# Patient Record
Sex: Female | Born: 1975 | Race: White | Hispanic: No | Marital: Married | Smoking: Current every day smoker
Health system: Southern US, Community
[De-identification: ages and names within clinical notes are randomized; demographics above are authoritative.]

## PROBLEM LIST (undated history)

## (undated) DIAGNOSIS — R51 Headache: Secondary | ICD-10-CM

## (undated) DIAGNOSIS — I10 Essential (primary) hypertension: Secondary | ICD-10-CM

## (undated) HISTORY — PX: APPENDECTOMY: SHX54

## (undated) HISTORY — PX: TUBAL LIGATION: SHX77

## (undated) HISTORY — PX: OVARY SURGERY: SHX727

## (undated) HISTORY — PX: ESOPHAGOGASTRODUODENOSCOPY ENDOSCOPY: SHX5814

## (undated) HISTORY — PX: ABDOMINAL HYSTERECTOMY: SHX81

## (undated) HISTORY — DX: Headache: R51

## (undated) HISTORY — PX: TONSILLECTOMY: SUR1361

---

## 2002-08-20 ENCOUNTER — Other Ambulatory Visit: Admission: RE | Admit: 2002-08-20 | Discharge: 2002-08-20 | Payer: Self-pay | Admitting: Internal Medicine

## 2003-10-27 ENCOUNTER — Emergency Department (HOSPITAL_COMMUNITY): Admission: EM | Admit: 2003-10-27 | Discharge: 2003-10-27 | Payer: Self-pay | Admitting: Emergency Medicine

## 2003-10-28 ENCOUNTER — Emergency Department (HOSPITAL_COMMUNITY): Admission: EM | Admit: 2003-10-28 | Discharge: 2003-10-28 | Payer: Self-pay | Admitting: *Deleted

## 2004-12-27 ENCOUNTER — Ambulatory Visit: Payer: Self-pay | Admitting: Family Medicine

## 2005-01-03 ENCOUNTER — Ambulatory Visit: Payer: Self-pay | Admitting: Family Medicine

## 2005-03-10 ENCOUNTER — Emergency Department (HOSPITAL_COMMUNITY): Admission: EM | Admit: 2005-03-10 | Discharge: 2005-03-10 | Payer: Self-pay | Admitting: Emergency Medicine

## 2005-06-03 ENCOUNTER — Ambulatory Visit: Payer: Self-pay | Admitting: Family Medicine

## 2005-06-07 ENCOUNTER — Ambulatory Visit (HOSPITAL_COMMUNITY): Admission: RE | Admit: 2005-06-07 | Discharge: 2005-06-07 | Payer: Self-pay | Admitting: Family Medicine

## 2005-06-07 ENCOUNTER — Ambulatory Visit: Payer: Self-pay | Admitting: Family Medicine

## 2005-06-14 ENCOUNTER — Ambulatory Visit: Payer: Self-pay | Admitting: Family Medicine

## 2005-07-16 ENCOUNTER — Ambulatory Visit: Payer: Self-pay | Admitting: Family Medicine

## 2005-07-24 ENCOUNTER — Ambulatory Visit (HOSPITAL_COMMUNITY): Admission: RE | Admit: 2005-07-24 | Discharge: 2005-07-24 | Payer: Self-pay | Admitting: Family Medicine

## 2005-08-05 ENCOUNTER — Emergency Department (HOSPITAL_COMMUNITY): Admission: EM | Admit: 2005-08-05 | Discharge: 2005-08-05 | Payer: Self-pay | Admitting: Emergency Medicine

## 2005-08-08 ENCOUNTER — Inpatient Hospital Stay (HOSPITAL_COMMUNITY): Admission: AD | Admit: 2005-08-08 | Discharge: 2005-08-08 | Payer: Self-pay | Admitting: Obstetrics and Gynecology

## 2007-08-10 ENCOUNTER — Observation Stay (HOSPITAL_COMMUNITY): Admission: EM | Admit: 2007-08-10 | Discharge: 2007-08-11 | Payer: Self-pay | Admitting: Emergency Medicine

## 2007-08-11 ENCOUNTER — Inpatient Hospital Stay (HOSPITAL_COMMUNITY): Admission: AD | Admit: 2007-08-11 | Discharge: 2007-08-18 | Payer: Self-pay | Admitting: *Deleted

## 2007-08-11 ENCOUNTER — Ambulatory Visit: Payer: Self-pay | Admitting: *Deleted

## 2007-09-05 ENCOUNTER — Emergency Department (HOSPITAL_COMMUNITY): Admission: EM | Admit: 2007-09-05 | Discharge: 2007-09-05 | Payer: Self-pay | Admitting: Emergency Medicine

## 2008-09-19 ENCOUNTER — Inpatient Hospital Stay (HOSPITAL_COMMUNITY): Admission: AD | Admit: 2008-09-19 | Discharge: 2008-09-19 | Payer: Self-pay | Admitting: Obstetrics and Gynecology

## 2008-09-30 ENCOUNTER — Encounter (INDEPENDENT_AMBULATORY_CARE_PROVIDER_SITE_OTHER): Payer: Self-pay | Admitting: Obstetrics and Gynecology

## 2008-09-30 ENCOUNTER — Ambulatory Visit (HOSPITAL_COMMUNITY): Admission: RE | Admit: 2008-09-30 | Discharge: 2008-10-01 | Payer: Self-pay | Admitting: Obstetrics and Gynecology

## 2009-02-09 ENCOUNTER — Emergency Department (HOSPITAL_COMMUNITY): Admission: EM | Admit: 2009-02-09 | Discharge: 2009-02-09 | Payer: Self-pay | Admitting: Emergency Medicine

## 2009-04-07 ENCOUNTER — Emergency Department (HOSPITAL_COMMUNITY): Admission: EM | Admit: 2009-04-07 | Discharge: 2009-04-07 | Payer: Self-pay | Admitting: Emergency Medicine

## 2010-11-20 LAB — CBC
HCT: 34.6 % — ABNORMAL LOW (ref 36.0–46.0)
HCT: 32.4 % — ABNORMAL LOW (ref 36.0–46.0)
HCT: 39.3 % (ref 36.0–46.0)
Hemoglobin: 11.7 g/dL — ABNORMAL LOW (ref 12.0–15.0)
Hemoglobin: 11.1 g/dL — ABNORMAL LOW (ref 12.0–15.0)
Hemoglobin: 13.1 g/dL (ref 12.0–15.0)
MCHC: 33.9 g/dL (ref 30.0–36.0)
MCHC: 33.4 g/dL (ref 30.0–36.0)
MCHC: 34.4 g/dL (ref 30.0–36.0)
MCV: 92.9 fL (ref 78.0–100.0)
MCV: 91.7 fL (ref 78.0–100.0)
MCV: 92.4 fL (ref 78.0–100.0)
Platelets: 310 10*3/uL (ref 150–400)
Platelets: 239 10*3/uL (ref 150–400)
Platelets: 317 10*3/uL (ref 150–400)
RBC: 3.72 MIL/uL — ABNORMAL LOW (ref 3.87–5.11)
RBC: 3.5 MIL/uL — ABNORMAL LOW (ref 3.87–5.11)
RBC: 4.28 MIL/uL (ref 3.87–5.11)
RDW: 12.6 % (ref 11.5–15.5)
RDW: 12.4 % (ref 11.5–15.5)
RDW: 12.4 % (ref 11.5–15.5)
WBC: 8.6 10*3/uL (ref 4.0–10.5)
WBC: 10.6 10*3/uL — ABNORMAL HIGH (ref 4.0–10.5)
WBC: 14 10*3/uL — ABNORMAL HIGH (ref 4.0–10.5)

## 2010-11-20 LAB — URINALYSIS, ROUTINE W REFLEX MICROSCOPIC
Bilirubin Urine: NEGATIVE
Glucose, UA: NEGATIVE mg/dL
Hgb urine dipstick: NEGATIVE
Ketones, ur: NEGATIVE mg/dL
Nitrite: NEGATIVE
Protein, ur: NEGATIVE mg/dL
Specific Gravity, Urine: <1.005 — ABNORMAL LOW (ref 1.005–1.030)
Urobilinogen, UA: 0.2 mg/dL (ref 0.0–1.0)
pH: 6 (ref 5.0–8.0)

## 2010-11-20 LAB — DIFFERENTIAL
Basophils Absolute: 0.2 10*3/uL — ABNORMAL HIGH (ref 0.0–0.1)
Basophils Relative: 2 % — ABNORMAL HIGH (ref 0–1)
Eosinophils Absolute: 0.3 10*3/uL (ref 0.0–0.7)
Eosinophils Relative: 4 % (ref 0–5)
Lymphocytes Relative: 42 % (ref 12–46)
Lymphs Abs: 3.6 10*3/uL (ref 0.7–4.0)
Monocytes Absolute: 0.5 10*3/uL (ref 0.1–1.0)
Monocytes Relative: 6 % (ref 3–12)
Neutro Abs: 4 10*3/uL (ref 1.7–7.7)
Neutrophils Relative %: 47 % (ref 43–77)

## 2010-11-20 LAB — PREGNANCY, URINE: Preg Test, Ur: NEGATIVE

## 2010-12-18 NOTE — Consult Note (Signed)
NAME:  SHERIDEN, ARCHIBEQUE              ACCOUNT NO.:  [id number]   MEDICAL RECORD NO.:  [id number]          PATIENT TYPE:  INP   LOCATION:  5508                           FACILITY:   PHYSICIAN:  Antonietta Breach, M.D.  DATE OF BIRTH:  10-27-1975   DATE OF CONSULTATION:  08/10/2007  DATE OF DISCHARGE:                                 CONSULTATION   REASON FOR CONSULTATION:  Suicide attempt.   REQUESTING PHYSICIAN:  [facility].   HISTORY OF PRESENT ILLNESS:  Mrs. Winifred Bodiford is a 35 year old female  admitted to the [facility] on August 09, 2007 due to an  intentional drug overdose.   The patient does have a history of depression.  She developed suicidal  thoughts with plan on January 3.  She could not resist the thoughts.  She took an overdose of Tylenol and Excedrin PM.  She slashed both of  her forearms with a blade.   The patient does continue with depressed mood, low energy, difficulty  concentrating.  She acknowledges the suicide attempt.  She states that  she did want to get help and actually sought help after the attempt.   She does not have any hallucinations or delusions.  She is oriented to  all spheres and is cooperative with bedside care.   PAST PSYCHIATRIC HISTORY:  The patient does have a history of multiple  self-harm episodes and was admitted to [facility] in [place]  during her teenage years.   FAMILY PSYCHIATRIC HISTORY:  None known.   SOCIAL HISTORY:  Mrs. Val Eagle states that she is separated from her  husband.  They were married for over 10 years.  She has gone to live  back home with her mother.  She cites that her mother treats her like a  child.  The patient does have four children.  She smokes marijuana.  She  uses occasional alcohol.  Occupation:  Unemployed.   PAST MEDICAL HISTORY:  Asthma, status post, superficial lacerations to  the bilateral forearms, status post Tylenol and Excedrin PM overdose.   ALLERGIES:  SULFA.   MEDICATIONS:  The MAR is reviewed.  1. The patient is on Mucomyst.  2. She is on Ativan 0.5 mg q.4 hours p.r.n.   LABORATORY DATA:  Tylenol initially was 114.3 and then increased at is  peak to 124.1.   WBC 15.6, hemoglobin 11.8, platelet count 344.  Sodium 140, BUN 5,  creatinine 0.56.  Alcohol negative.  Aspirin negative.  SGOT 17, SGPT  13.  Urine drug screen positive for benzodiazepines and positive for  tetrahydrocannabinol.  Tricyclics negative.   REVIEW OF SYSTEMS:  Head, eyes, ears, nose, and throat, mouth,  neurologic, psychiatric, cardiovascular, respiratory, gastrointestinal,  genitourinary, skin, musculoskeletal, endocrine, metabolic, hematologic,  lymphatic:  All unremarkable, except for a slight anemia with hemoglobin  of 11.8.   PHYSICAL EXAMINATION:  VITAL SIGNS:  Temperature 98.6, pulse 69,  respiratory rate 18, blood pressure 135/78, O2 saturation on room air  97%.  GENERAL APPEARANCE:  Mrs. Val Eagle is a female appearing her chronologic  age, lying in a supine position in her  hospital bed with no abnormal  involuntary movements.  MENTAL STATUS:  Mrs. Val Eagle is alert.  Her attention span is within  normal limits.  Her eye contact is good.  Her affect is constricted with  tears.  Her mood is depressed.  She is oriented to all spheres.  Her  memory is intact to immediate, recent, and remote.  Her fund of  knowledge and intelligence are within normal limits.  Her speech  involves normal rate and prosody without dysarthria.  Her speech volume  is decreased.  Thought process logical, coherent, AND goal-directed.  No  looseness of associations.  Language expression and comprehension  intact.  Abstraction intact.  Thought content:  The patient acknowledges  suicidal intent.  She has thoughts of hopelessness and helplessness.  She has no delusions or hallucinations.  Insight is partial.  Judgment  is impaired for self-care outside the hospital.  However, she does have  the  capacity for informed consent regarding treatment.   ASSESSMENT:  AXIS I:  293.83 mood disorder not otherwise specified,  depressed (general medical and functional elements).  Rule out 296.33 major depressive disorder recurrent, severe.  Cannabis abuse.  AXIS II:  Deferred.  AXIS III:  See general medical section.  AXIS IV:  Primary support group.  Marital.  AXIS V:  20.   Mrs. Val Eagle is still at risk to harm herself.   RECOMMENDATIONS:  1. Would admit to an inpatient psychiatric unit once medically      cleared.  2. Psychotropic medications deferred at this time.  3. The undersigned provided ego supportive psychotherapy and      education.      Antonietta Breach, M.D.  Electronically Signed     JW/MEDQ  D:  08/10/2007  T:  08/11/2007  Job:  [id number]

## 2010-12-18 NOTE — H&P (Signed)
NAME:  [name] [name], [name] [name]              ACCOUNT NO.:  [id number]   MEDICAL RECORD NO.:  [id number]          PATIENT TYPE:  MAT   LOCATION:  MATC                          FACILITY:  [facility]   PHYSICIAN:  Hal Morales, M.D.DATE OF BIRTH:  04-29-1976   DATE OF ADMISSION:  09/19/2008  DATE OF DISCHARGE:  09/19/2008                              HISTORY & PHYSICAL   HISTORY OF PRESENT ILLNESS:  The patient is a 35 year old white married  female para 4-0-1-4 who presents for definitive therapy of abnormal  uterine bleeding and pelvic pain.  The patient gives a history of pelvic  pain off and on for about 11 years.  She states that prior to her last  delivery she did not have significant pelvic pain.  Since that time she  has had intermittent episodes of pain, irregular menses that last for as  much as 2 weeks but has had an episode of off and on bleeding for as  much as 6 months.  Her last menstrual period began in November of 2009  and bled off and on until February.  She has used birth control pills  and other hormonal measures without significant and satisfactory relief  of her bleeding.  She began menarche at age 73 and had fairly regular  periods up until the time that she had her children, then about 11 years  ago after the birth of her last child began to have the above noted  problems.   She tried oral contraceptive pills but discontinued those secondary to  palpitations.  She had used Provera in the past which seemed to work for  her bleeding but she had a rash breakout and discontinued Provera.  She  has used Depo-Provera in the past but complained of weight gain.  She  has never used Lupron therapy.   In 2000 the patient had an appendectomy and right salpingo-oophorectomy  for endometriosis.  She had tubal ligation for sterilization later in  2000 and has had irregular menses since that time.   She does have a history of pelvic inflammatory disease treated at age  26.   She  currently complains of intermittent abdominal pain, dyspareunia, but  denies dysuria, urinary frequency or urgency or hematuria.  She has  never had a kidney stone.  She does not complain of constipation,  diarrhea or rectal bleeding.  She has no nausea or vomiting associated  with the pain.  She has never been diagnosed with fibroids.   Her last Pap smear was done on September 05, 2008, and showed low-grade  squamous intraepithelial lesion.  This was followed up with a  colposcopically directed biopsy which likewise showed low-grade squamous  intraepithelial lesion and a benign endocervical canal sampling.   OBSTETRICAL HISTORY:  Normal spontaneous vaginal delivery in 1996.  In  1998 cesarean section for twins.  In 1999 normal spontaneous vaginal  delivery after C-section of a 6 pound 7 ounce infant, the largest of her  deliveries.  She had a postpartum tubal sterilization.   GYN HISTORY:  See above.   PAST MEDICAL HISTORY:  The patient has  a history of asthma but currently  uses no medication.  She has had kidney infections, frequent headaches  and intestinal problems but currently requires no medication.  She has  had anemia in the past thought to be secondary to prolonged bleeding.  She has a history of migraines.  She likewise has a history of emotional  problems.   PAST SURGICAL HISTORY:  Appendectomy and right salpingo-oophorectomy in  2000, tubal ligation in 1999, tonsillectomy as a child, cesarean section  in 1998.  The patient has had two endoscopies in the past.   FAMILY HISTORY:  Positive for asthma, thyroid disease, headaches,  intestinal problems, anemia, bladder infection, migraines and emotional  problems.  Diet is regular.  The patient occasionally exercises.   SOCIAL HISTORY:  The patient is separated.  She does not smoke  cigarettes but is exposed to secondhand smoke.  She does not drink  alcohol.  The patient is unemployed.      Hal Morales, M.D.   Electronically Signed     VPH/MEDQ  D:  09/28/2008  T:  09/28/2008  Job:  [id number]

## 2010-12-18 NOTE — H&P (Signed)
NAME:  [name] [name], [name] [name]              ACCOUNT NO.:  [id number]   MEDICAL RECORD NO.:  [id number]          PATIENT TYPE:  IPS   LOCATION:  0304                          FACILITY:  [facility]   PHYSICIAN:  Jasmine Pang, M.D. DATE OF BIRTH:  October 03, 1975   DATE OF ADMISSION:  08/11/2007  DATE OF DISCHARGE:                       PSYCHIATRIC ADMISSION ASSESSMENT   IDENTIFYING INFORMATION:  A 35 year old female voluntarily admitted on  August 11, 2007.   HISTORY OF PRESENT ILLNESS:  The patient presents with a history of  intentional overdose, overdosing on 24 Excedrin tablets, and also  sustained a self-inflicted injury by cutting herself on both her  forearms.  She states that she wanted to die.  She did this on Sunday at  her ex-husband's home.  The patient went out and bought the medication  at [employer] with the intention to overdose.  She again also had cut both  her arms in a suicide attempt.  She states that she then took [place] up to  the emergency department but does not really remember been getting there  and had to be taken out of the parking lot.  She reports she has been  feeling very depressed.  She left her husband a few months ago because  things were getting physical and she did not want things to escalate.  Her sleep has been decreased.  She goes to bed around 2 and is up at 6.  Her appetite has been satisfactory.  She states that she is also living  in her mother's home at this time and does not like to deal with  issues. She states that the overdose was impulsive. She was not  drinking at the time.  She denies any alcohol or drug use.  And feels  that she needs to get some help.   PAST PSYCHIATRIC HISTORY:  First admission to the [facility].  No current outpatient treatment. She did receive some  counseling as a teenager. Has a history of cutting since the age of 25.  In the past has been on Zoloft but she states she had problems with  tachycardia. She was also on  some Prozac.   SOCIAL HISTORY:  This is a 35 year old separated female.  The patient is  living at home with her mother for the first time in 13 years.  The  patient has four children ages 73, twins at age 70, and on 47-year-old.  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 She has an eighth grade  education and currently has a boyfriend.   FAMILY HISTORY:  None.   ALCOHOL AND DRUG HISTORY:  She denies any alcohol or drug use.   PRIMARY CARE [place] Shiraishi:  She states that she does not like to go to  doctors and has not been. She has one but has not seen her doctor in  quite some time. Cannot recall MD's name.   MEDICAL PROBLEMS:  History of asthma.   MEDICATIONS:  She takes albuterol inhaler.   DRUG ALLERGIES:  SULFA.   PHYSICAL EXAM:  This is a young female who was fully assessed and  hospitalized at [facility] for her ingestion of Excedrin.  VITAL SIGNS:  Her temperature is 98.3, 77 heart rate, 20 respirations,  blood pressure 151/94, 172 pounds, 5 feet 4 inches tall.  EXTREMITIES:  She also has multiple superficial lacerations, linear  lacerations, to her forearms.  HEENT:  Also noted is subconjunctival hemorrhage to both her eyes.  She  states from vomiting when she overdosed.   LABORATORY RESULTS:  Her TSH is 3.125, total protein is 5.5, hemoglobin  of 10, hematocrit of 30.9, BUN 2, Tylenol was elevated at 124, down to  86 and down to 28.8. Urine drug screen is positive for benzodiazepines,  positive for cannabis. Pregnancy test is unavailable at this time.   MENTAL STATUS EXAM:  She is fully alert.  She is cooperative, good eye  contact, casually dressed.  Her speech is clear, normal in pace and  tone.  The patient's mood is depressed.  The patient is pleasant, sad,  wanting help.  Thought processes are coherent and goal directed.  Cognitive function intact.  Her memory is good.  Judgment and insight  are good.  She appears sincere.   IMPRESSION:  AXIS I:  Major depressive  disorder, recurrent.  Cannabis  abuse.  AXIS II:  Deferred.  AXIS III:  Asthma.  AXIS IV:  Problems with housing. Psychosocial problems. Possible  problems with primary support group.  AXIS V:  Current is 35.   PLANS:  Contract for safety.  Stabilize mood and thinking.  The patient  was willing to begin an antidepressant.  We discussed Celexa. The  patient is agreeable to beginning medication.  The patient is to  increase coping skills.  Will also address her substance use. Will have  a family session with her support group.  She does report her mother  being supportive.  Case manager will also determine her follow-up.  Her  tentative length of stay is  3-5 days.  The patient will need follow-up  in regards to her anemia that was discussed with her. We will start the  patient on an iron supplement at this time for her medical issues and  health maintenance.      Landry Corporal, N.P.      Jasmine Pang, M.D.  Electronically Signed    JO/MEDQ  D:  08/12/2007  T:  08/13/2007  Job:  [id number]

## 2010-12-18 NOTE — Op Note (Signed)
NAME:  [name] [name], [name] [name]              ACCOUNT NO.:  [id number]   MEDICAL RECORD NO.:  [id number]          PATIENT TYPE:  OIB   LOCATION:  9318                          FACILITY:  [facility]   PHYSICIAN:  Hal Morales, M.D.DATE OF BIRTH:  10-Feb-1976   DATE OF PROCEDURE:  09/30/2008  DATE OF DISCHARGE:  09/19/2008                               OPERATIVE REPORT   PREOPERATIVE DIAGNOSES:  1. Pelvic pain.  2. Abnormal uterine bleeding.  3. History of endometriosis.  4. Low-grade squamous intraepithelial lesion status post right      salpingo-oophorectomy.   POSTOPERATIVE DIAGNOSES:  1. Pelvic pain.  2. Abnormal uterine bleeding.  3. History of endometriosis.  4. Low-grade squamous intraepithelial lesion status post right      salpingo-oophorectomy.  5. Pelvic adhesions.   PROCEDURE:  Laparoscopically-assisted vaginal hysterectomy and left  ovarian cystectomy.   SURGEON:  Vanessa P. Pennie Rushing, MD   FIRST ASSISTANT:  Janine Limbo, MD   ANESTHESIA:  General orotracheal.   FINDINGS:  The uterus was normal-sized.  The patient was status post  right salpingo-oophorectomy.  The left fallopian tube contained a Falope  ring for surgical sterilization.  The left ovary contained 2 less than 2  cm simple cysts, both containing clear fluid.  There were adhesions  between the fimbriated end of the left fallopian tube and the posterior  uterus.   PROCEDURE:  The patient was taken to the operating room after  appropriate identification and placed on the operating table.  After the  attainment of adequate general anesthesia, she was placed in the  modified lithotomy position.  The abdomen, perineum, and vagina were  prepped with multiple layers of Betadine and a Hulka tenaculum placed on  the anterior cervix.  Foley catheter was inserted into the bladder and  connected to straight drainage.  The abdomen and perineum were draped as  a sterile field.  A subumbilical and suprapubic injection of  0.25%  Marcaine for total of 10 mL was undertaken and a subumbilical incision  made.  A Veress cannula was placed through that incision into the  peritoneal cavity.  Pneumoperitoneum was created with 4 liters of CO2.  The Veress cannula was removed and laparoscopic trocar placed through  the subumbilical incision into the peritoneal cavity.  The laparoscope  was placed through the trocar sleeve.  The above-noted findings were  made.  Suprapubic incisions to the right and left of midline were made  and laparoscopic probe trocars placed through those incisions into the  peritoneal cavity under direct visualization.  The aforementioned  adhesions between the fallopian tube and the posterior uterus were  sharply lysed and hemostasis achieved.  The left ovary was elevated and  the largest of the left ovarian cyst excised.  Hemostasis was achieved  with cautery.  At this point, the surgeons moved to the perineum as the  site of operation.  A weighted speculum was placed in the vagina and  Lahey tenaculum placed on the anterior and posterior surfaces of the  cervix.  The cervicovaginal mucosa was injected with a dilute solution  of Pitressin.  The cervix was circumscribed and the anterior vaginal  mucosa dissected off the anterior cervix bluntly.  The posterior  peritoneum was entered sharply.  This posterior peritoneum was then  tagged.  The uterosacral ligaments on the right and left side were  clamped, cut, and suture ligated and those sutures held.  The  paracervical tissues, parametria tissues were clamped, cut, and suture  ligated on the right and left side successively.  The anterior  peritoneum was entered and the bladder blade placed.  The upper pedicles  were then clamped, cut, and doubly suture ligated with removal of the  uterus and cervix from the operative field.  Hemostatic sutures were  then placed in the left vaginal cuff to allow for adequate hemostasis.  The left ovary was  visualized and the second of 2 left ovarian cysts  removed with cautery.  Once hemostasis was noted to be adequate, a  McCall culdoplasty suture was placed in the posterior cul-de-sac  incorporating the 2 uterosacral ligaments and the intervening posterior  peritoneum.  The vaginal angles were then created with the sutures that  had been held from the uterosacral ligaments being placed in the  anterior vaginal cuff and posterior vaginal cuff and tying down at the  vaginal angles.  The remainder of the vaginal cuff was then closed with  figure-of-eight sutures all sutures had been 0 Vicryl.  The vaginal cuff  closure was a single layer incorporating the anterior vaginal mucosa,  anterior peritoneum, posterior peritoneum, and posterior vaginal mucosa  in each stitch.  The McCall culdoplasty suture was tied down and the  vagina packed with a 2-inch packing moistened with Estrace cream.  The  surgeon changed gloves and the pneumoperitoneum was recreated.  The  pelvis was then visualized and found to be hemostatic after irrigation.  All instruments were then removed from the peritoneal cavity under  direct visualization as the CO2 was allowed to escape.  The subumbilical  incision was closed with a fascial suture of 0 Vicryl and subcuticular  sutures of 3-0 Monocryl.  The suprapubic incisions were closed with 3-0  sutures of Monocryl and a subcuticular fashion.  Sterile dressings were  applied.  The patient was awakened from general anesthesia and taken to  the recovery room in satisfactory condition having tolerated the  procedure well with sponge and instrument counts correct.   SPECIMENS TO PATHOLOGY:  Left ovarian cyst and uterus and cervix.   ESTIMATED BLOOD LOSS:  100 mL.      Hal Morales, M.D.  Electronically Signed     VPH/MEDQ  D:  09/30/2008  T:  10/01/2008  Job:  [id number]

## 2010-12-18 NOTE — Consult Note (Signed)
NAME:  [name] [name]              ACCOUNT NO.:  [id number]   MEDICAL RECORD NO.:  [id number]          PATIENT TYPE:  MAT   LOCATION:  MATC                          FACILITY:  [facility]   PHYSICIAN:  Janine Limbo, M.D.DATE OF BIRTH:  1976/06/27   DATE OF CONSULTATION:  DATE OF DISCHARGE:  09/19/2008                                 CONSULTATION   HISTORY OF PRESENT ILLNESS:  [name] [name] is a 35 year old female who  presents to the maternity admissions area at the [facility]  [place].  She has been followed at the [facility]  and Gynecology Division of [facility] for women.  The patient  presents complaining of severe abdominal pain.  The patient has a  history of chronic pelvic pain.  She is status post right salpingo-  oophorectomy because of pain in a dermoid cyst.  She is also status post  laparoscopy because of pelvic pain.  She was described to have  endometriosis, though no biopsy was obtained.  The patient has been  scheduled for hysterectomy on September 30, 2008, because of pelvic pain.  She reports that her pain has increased significantly.  She was given  Ultram and Flexeril for pain.  She reports that her pain increased to  the point that those medications were not sufficient.  She was given a  refill of those medications on September 18, 2008.  She presents to  maternity admissions today because her pain is progressed to the point  that she says she can no longer tolerate the discomfort.  The patient  has a history of pelvic inflammatory disease as a teenager.  She is  status post appendectomy.   OBSTETRICAL HISTORY:  The patient has had four term deliveries.   DRUG ALLERGIES:  SULFA MEDICATIONS.   PAST MEDICAL HISTORY:  The patient has a history of migraine headaches  and she has a history of asthma.  The patient did have a suicide attempt  in January 2009.   REVIEW OF SYSTEMS:  The patient reports having bowel movements twice a  day and she denies GI symptoms.   SOCIAL HISTORY:  The patient is married and she denies cigarette  smoking.   FAMILY HISTORY:  Noncontributory.   PHYSICAL EXAMINATION:  VITAL SIGNS:  Blood pressure is 154/96,  respirations 18, pulse 85, temperature is 97.8.  HEENT:  Within normal limits except for the patient it does appear to be  moderately uncomfortable.  CHEST:  Clear.  HEART:  Regular rate and rhythm.  ABDOMEN:  Soft and there is no guarding or rebound.  EXTREMITIES:  Grossly normal.  NEUROLOGIC:  Grossly normal.  PELVIC:  External genitalia is normal.  Vagina is normal.  Cervix is  slightly tender.  Uterus is normal size, shape, and consistently and  minimally tender.  Adnexa, right adnexa is nontender, but left adnexa is  very tender.  No masses are appreciated on the left.   LABORATORY DATA:  White blood cell count is 8600, hemoglobin is 11.7,  hematocrit is 34.6%, platelet count is 310,000.  Urinalysis is negative.   Ultrasound, no  pelvic masses are appreciated.  The ultrasound is normal.  There is a 1.6 cm minimally complex cyst on the left.   EMERGENCY ROOM COURSE:  The patient was evaluated in the emergency  department at [facility].  She was given 60 mg of Toradol IM.  The  patient reports that her discomfort was not relieved.  An ultrasound was  obtained.  She was watched for approximately 2-3 hours.  Her vital signs  remained stable.  The patient was then given Stadol 2 mg IM and  Phenergan 12.5 mg IM.  She was discharged to home.  Her husband will  drive.   ASSESSMENT:  1. Left lower quadrant pain of uncertain etiology.  2. Questionable history of endometriosis and a possibility that her      pain is from endometriosis.  3. A 1.6 cm mentally complex left ovarian cyst.  4. Anemia.   PLAN:  1. The patient will be discharge to home.  She will follow up with Dr.      Pennie Rushing in preparation for her surgery.  Of note is the patient did      have a low grade  squamous intraepithelial lesion on her most recent      Pap smear and colposcopy with biopsies will be performed prior to      surgery.  2. The patient was given a prescription for Percocet 5/325 and she      will take 1-2 tablets every 4 hours as needed for severe pain.  She      was given a total of 20 tablets with no refills.  She was also      given Phenergan and she can take 25 mg every 6 hours as needed for      nausea.      Janine Limbo, M.D.  Electronically Signed     AVS/MEDQ  D:  09/19/2008  T:  09/20/2008  Job:  [id number]

## 2010-12-18 NOTE — Discharge Summary (Signed)
NAME:  [name] [name], [name] [name]              ACCOUNT NO.:  [id number]   MEDICAL RECORD NO.:  [id number]          PATIENT TYPE:  INP   LOCATION:  5508                         FACILITY:  [facility]   PHYSICIAN:  Madaline Savage, MD        DATE OF BIRTH:  1976/01/27   DATE OF ADMISSION:  08/10/2007  DATE OF DISCHARGE:  08/11/2007                               DISCHARGE SUMMARY   PRIMARY CARE PHYSICIAN:  Unassigned.   DISCHARGE DIAGNOSES:  1. Overdose on Excedrin PM.  2. Major depression  3. Attempted suicide.  4. Multiple self-inflicted superficial cuts on forearms.   DISCHARGE MEDICATIONS:  Ibuprofen 400 mg three times daily as needed.   HISTORY OF PRESENT ILLNESS:  For a full History of Present Illness see  the History and Physical by Dr. Della Goo.  In short Ms. Val Eagle  is a 35 year old lady who apparently overdosed on 24 tablets of Excedrin  PM one hour prior to coming to the ER.  She also cut her forearm several  times.  She has had three suicide attempts in the past.  She was  admitted for further evaluation and management.   PROBLEM LIST:  Excedrin overdose.  Excedrin contains Tylenol, caffeine,  and aspirin.  Her Tylenol level at four hours was 114.3 which is less  than 150, and so Mucomyst was not given to her.  Her Tylenol level was  followed which has come down to less than normal within 24 hours.  Her  alcohol level was less than 5.  She has been watched in the hospital for  more than 24 hours, and this time she is hemodynamically stable.  Her  liver enzymes at the time of discharge are within normal limits.  At  this time she is medically cleared for psychiatric admission.  She was  seen by Dr. Jeanie Sewer from psychiatric who recommends that she needs  inpatient psychiatric treatment.  He deferred psychotropics at this  time.   DISPOSITION:  She is now being discharged to inpatient psychiatry.  She  will be followed by the physicians at the psychiatric facility.      Madaline Savage, MD  Electronically Signed     PKN/MEDQ  D:  08/11/2007  T:  08/11/2007  Job:  [id number]

## 2010-12-18 NOTE — H&P (Signed)
NAME:  [name] [name], [name] [name]              ACCOUNT NO.:  [id number]   MEDICAL RECORD NO.:  [id number]          PATIENT TYPE:  AMB   LOCATION:  SDC                           FACILITY:  [facility]   PHYSICIAN:  Hal Morales, M.D.DATE OF BIRTH:  1976/01/12   DATE OF ADMISSION:  09/30/2008  DATE OF DISCHARGE:  09/19/2008                              HISTORY & PHYSICAL   ADDENDUM/CONTINUATION:   SOCIAL HISTORY:  The patient is divorced, does not smoke, is exposed to  secondhand smoke, and does not drink alcoholic beverages.  She is  currently unemployed.   REVIEW OF SYSTEMS:  Negative except as mentioned above.   PHYSICAL EXAMINATION:  CONSTITUTIONAL:  The patient is a well-developed  white female in no acute distress.  VITAL SIGNS:  The temperature is 98.3, pulse 66, respirations 16, blood  pressure 110/80, weight 164 pounds, height 5 feet 3 inches.  LUNGS:  Clear.  HEART:  Regular rate and rhythm.  ABDOMEN:  Soft without masses or organomegaly.  EXTREMITIES:  No clubbing, cyanosis or edema.  PELVIC:  EG, BUS shows minuscule condylomata over the clitoral hood.  The vagina is without lesions.  The cervix is without lesions.  The  uterus is upper limits of normal size, mildly tender to motion and  sounds to 9 cm.  The adnexa are without masses or tenderness.  Rectovaginal, no masses.   LABORATORY EVALUATION:  Urinalysis is negative.  Hemoglobin is 11.7.  White blood cell count is 8.6 with a normal differential.  The patient  has a normal TSH of 1.802 and a normal prolactin of 10.3.  Most recent  pelvic ultrasound done on September 19, 2008 showed a normal-appearing  uterus status post a right hysterectomy and no evidence for left ovarian  torsion.  The Pap smear showed low grade squamous intraepithelial  lesion.  Cervical biopsy showed low grade cervical intraepithelial  lesion and negative ECC.  Endometrial biopsy showed complex endometrial  hyperplasia with out atypia.   IMPRESSION:  1.  Abnormal uterine bleeding.  2. Pelvic pain.  3. History of untreated endometriosis.  4. 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  5. Complex endometrial hyperplasia without atypia.  6. Status post right salpingo-oophorectomy.  7. Condylomata.  8. History of asthma.  9. History of anemia.   DISPOSITION:  Discussions held with the patient concerning options for  management of her comprehensive problems.  In light of the multiple  gynecologic issues that are present, the patient has decided she wishes  to proceed with hysterectomy.  She will undergo laparoscopy with  evaluation for laparoscopically-assisted vaginal  hysterectomy at [facility] on September 30, 2008.  The risks of  anesthesia, bleeding, infection and damage to adjacent organs have been  explained to her.  It has also been explained that no guarantee can be  made concerning the relief of her pelvic pain.  She wishes to proceed.      Hal Morales, M.D.  Electronically Signed     VPH/MEDQ  D:  09/28/2008  T:  09/28/2008  Job:  [id number]

## 2010-12-18 NOTE — Discharge Summary (Signed)
NAME:  [name] [name], [name] [name]              ACCOUNT NO.:  [id number]   MEDICAL RECORD NO.:  [id number]          PATIENT TYPE:  IPS   LOCATION:  0304                          FACILITY:  [facility]   PHYSICIAN:  Jasmine Pang, M.D. DATE OF BIRTH:  1975/08/31   DATE OF ADMISSION:  08/11/2007  DATE OF DISCHARGE:  08/18/2007                               DISCHARGE SUMMARY   IDENTIFYING INFORMATION:  This is a 35 year old white female who was  admitted on August 11, 2007.   HISTORY OF PRESENT ILLNESS:  The patient presents with a history of  intentional overdose.  She was overdosing on 24 Excedrin tablets.  She  also sustained a self-inflicted injury by cutting herself on both her  forearms.  She states that she wanted to die.  She did this on the day  prior to admission at her ex-husband's home.  The patient went out and  bought the medication at [employer] with the intention to overdose.  She  again also cut both her arms in a suicide attempt.  She states she was  then taken to the emergency department, but cannot remember getting  there and had to be taken out of the parking lot.  She reports she has  been feeling very depressed.  She left her husband a few months ago  because things were getting physical.  She did not want things to  escalate.  Her sleep has been decreased.  She goes to bed around 2  o'clock in the morning and is up at 6 a.m.  Her appetite has been  satisfactory.  She states that she is also living in her mother's home  at night and does not like to deal with issues.  She states that the  overdose was impulsive.  She was not drinking at that time.  She denies  any alcohol or drug use.  She feels that she needs to get some help.  This is the first admission at the [facility].  There is  no current outpatient treatment.  She did receive some counseling as a  teenager.  She has a history of cutting since the age of 47.  In the  past, she has been on Zoloft, but she states  there was problems with  tachycardia.  She was also on some Prozac.  There is no alcohol or drug  use history.  She has a history of asthma, but states she has not been  to the doctor in a long time.  She takes an albuterol inhaler.   She is allergic to SULFA drugs.   PHYSICAL FINDINGS:  This young female was fully assessed and  hospitalized at [facility] for her ingestion of Excedrin.  There were no  acute physical or medical problems upon transfer here, other than  subconjunctival hemorrhage to both her eyes from vomiting when she  overdosed.   ADMISSION LABORATORIES:  TSH was 3.125, total protein was 5.5,  hemoglobin 10, hematocrit 30.9, and BUN 2.  Tylenol was elevated at 124,  then down to 86, and down to 28.8.  Urine drug screen was  positive for  benzodiazepines and positive for cannabis.   HOSPITAL COURSE:  Upon admission, the patient was started on Librium 25  mg p.o. q.6 h. p.r.n. symptoms of withdrawal and Ambien 10 mg p.o.  p.r.n. insomnia.  On August 12, 2007, the patient was started on Celexa  20 mg p.o. daily.  She was started on Midrin for migraine headaches.  On  August 14, 2007, Ambien was discontinued.  She was started on trazodone  50 mg at 9 p.m., may repeat x1.  On August 15, 2007, trazodone was  increased to 100 mg p.o. q.h.s.  On August 16, 2007, due to headache,  she was started on Ultram 50 mg p.o. x1.  She was started on Topamax 50  mg p.o. q.h.s.  On August 17, 2007, the patient was started on Restoril  30 mg p.o. q.h.s. for sleep.   In sessions with me, the patient was friendly and cooperative.  She also  participated appropriately in unit therapeutic groups and activities.  She discussed being under stress living with her mother.  There was a  lot of conflict there.  She had left her husband.  She discussed being  in treatment for depression as a teenager and had been given Prozac.  She has four children, ages 48, 5, 42, and 21 years old  respectively.  She works as a Child psychotherapist and describes this as a lot of stress.  As  hospitalization progressed, the patient continued to have difficulty  falling asleep.  Changes were made in her sleep medications as indicated  above.  She did become less depressed and less anxious.  No suicidal  ideation.  On August 16, 2007, the patient's boyfriend and the  patient's sister met for a family session.  The patient spoke about  temporarily planning to spend the night with her boyfriend, have her  sister pick her up each morning, and so she cannot stay at the house all  day long.  All agreed for this short-term plan.  The patient received a  lot of support from her boyfriend and sister.  She identified ways to  change her life by getting instructions talking about her feelings.  She  no longer felt suicidal.  On August 18, 2007, mental status had  improved markedly from admission status.  The patient was friendly and  cooperative with good eye contact.  Speech was of normal rate and flow.  Psychomotor activity was within normal limits.  Mood was less depressed  and less anxious.  Affect wide range.  There was no suicidal or  homicidal ideation.  No thoughts of self injurious behavior.  No  auditory or visual hallucinations.  No paranoia or delusions.  Thoughts  were logical and goal-directed.  Thought content, no predominant theme.  Cognitive was grossly back to baseline.  The patient was felt stable to  be discharged today.   DISCHARGE DIAGNOSES:  Axis I:  Major depression, recurrent and severe  and polysubstance abuse.  AXIS II:  None.  AXIS III:  Asthma.  AXIS IV:  Severe (problems with housing, psychosocial problems, problems  with primary support group, burden of psychiatric illness).  AXIS V:  Global assessment of functioning upon discharge was 50.  Global  assessment of functioning upon admission was 35.  Global assessment of  functioning highest past year was 60-65.    DISCHARGE PLANS:  There were no specific activity level or dietary  restrictions.   POST HOSPITAL CARE PLANS:  The patient  will be seen at the [facility]  for medications and counseling on August 18, 2007, at 10 a.m.  She will  also receive follow up with her primary care doctor for her medical  problems, including her decreased hemoglobin and hematocrit.   DISCHARGE MEDICATIONS:  1. Celexa 20 mg daily.  2. Iron 325 mg daily.  3. Multivitamin daily.  4. Restoril 30 mg at bedtime.  5. Topamax 50 mg at bedtime.      Jasmine Pang, M.D.  Electronically Signed     BHS/MEDQ  D:  08/18/2007  T:  08/18/2007  Job:  [id number]

## 2010-12-18 NOTE — H&P (Signed)
NAME:  [name] [name], [name] [name]              ACCOUNT NO.:  [id number]   MEDICAL RECORD NO.:  [id number]          PATIENT TYPE:  INP   LOCATION:  5508                         FACILITY:  [facility]   PHYSICIAN:  Della Goo, M.D. DATE OF BIRTH:  [birth year]   DATE OF ADMISSION:  08/09/2007  DATE OF DISCHARGE:                              HISTORY & PHYSICAL   CHIEF COMPLAINT:  Overdose.   HISTORY OF PRESENT ILLNESS:  This is a 35 year old female who was  brought to the emergency department emergently about one hour after  taking an overdose of 24 tablets of Excedrin PM.  The patient's husband  is at bedside and gives the history.  The patient is drowsy, however, is  arousable and responding to questions, as well.  She reports that this  was an attempt to kill herself.  She also drank one beer and cut both of  her arms.  She reports self-mutilating since the age of 28.  She also  reports that this is the third suicide attempt/gesture.   PAST MEDICAL HISTORY:  History of asthma.   MEDICATIONS:  None at this time.   PAST SURGICAL HISTORY:  1. History of a right ovarian cyst removal.  2. Appendectomy.  3. Bilateral tubal ligation.   ALLERGIES:  TO SULFA.   SOCIAL HISTORY:  The patient is a smoker, smokes a pack per day.  She  also drinks alcohol, two beers daily and also reports marijuana usage.   FAMILY HISTORY:  Noncontributory.   PHYSICAL EXAMINATION FINDINGS:  GENERAL:  A 35 year old well-nourished,  well-developed female currently somnolent but arousable.  VITAL SIGNS:  Her vital signs are stable.  She is afebrile.  Temperature  98, blood pressure 149/89, heart rate 89, respirations 14, O2  saturations 99%-100%.  HEENT: Examination normocephalic, atraumatic.  Pupils sluggish but  reactive to light.  Extraocular muscles are intact.  Funduscopic benign.  Oropharynx is clear.  NECK:  Neck is supple, full range of motion.  No thyromegaly,  adenopathy, jugular venous distention.  CARDIOVASCULAR:  Regular rate and rhythm.  No murmurs, gallops or rubs.  LUNGS: Clear to auscultation bilaterally.  ABDOMEN: Positive bowel sounds, soft, nontender, nondistended.  EXTREMITIES: Without cyanosis, clubbing or edema.  NEUROLOGIC EXAMINATION:  Somnolent but arousable.  She is alert and  oriented times three.  There are no focal deficits on examination.  SKIN EXAMINATION:  Bilateral upper extremities with multiple linear  lacerations present along both upper extremities, the forearm areas.   LABORATORY STUDIES:  White blood cell count 15.6, hemoglobin 11.8,  hematocrit 35.8, platelets 344, MCV 81, neutrophils 61%, lymphocytes  29%.  Sodium 140, potassium 3.7, chloride 106, bicarb 25, BUN 5,  creatinine 0.56 and glucose of 100.  Albumin 4.2, AST 17, ALT 13.  Urine  drug screen positive for benzodiazepines and positive for cannabis.  Acetaminophen level initially 114.3, salicylate level less than 4.  Also  note that the acetaminophen level was felt to be at two hours post  ingestion.  A repeat Tylenol level was performed at four hours post  ingestion and was found to be 86.  ASSESSMENT:  A 35 year old female being admitted with:  1. Overdose.  2. Suicide attempt.  3. Bilateral upper extremity lacerations.  4. Marijuana abuse.  5. Tobacco abuse.   PLAN:  The patient will be admitted to an area for cardiac monitoring  and will be placed on suicide precautions.  She will be placed on IV  fluids for hydration and maintenance therapy.  Poison Control has been  contacted, and the emergency room physician decided to place the patient  on IV Mucomyst therapy to treat the Tylenol ingestion and possible  Tylenol toxicity.  GI and DVT prophylaxis have been ordered as well and  wound care evaluation of the patient's bilateral upper extremity  lacerations.  The patient will be evaluated by psychiatry.      Della Goo, M.D.  Electronically Signed     HJ/MEDQ  D:   08/10/2007  T:  08/11/2007  Job:  [id number]

## 2010-12-21 NOTE — Discharge Summary (Signed)
NAME:  [name], [name]              ACCOUNT NO.:  [id number]   MEDICAL RECORD NO.:  [id number]          PATIENT TYPE:  OIB   LOCATION:  9318                          FACILITY:  [facility]   PHYSICIAN:  Hal Morales, M.D.DATE OF BIRTH:  29-Jan-1976   DATE OF ADMISSION:  09/30/2008  DATE OF DISCHARGE:  10/01/2008                               DISCHARGE SUMMARY   DISCHARGE DIAGNOSES:  Pelvic pain, abnormal uterine bleeding, history of  endometriosis, low grade squamous intraepithelial lesion of the cervix,  pelvic adhesions, and adenomyosis.   OPERATION:  On the date of admission, the patient underwent a  laparoscopically-assisted vaginal hysterectomy and a left ovarian  cystectomy tolerating procedures well.   HISTORY OF PRESENT ILLNESS:  [name] [name] is a 35 year old married white  female para 4-0-1-4 who is status post bilateral tubal ligation and  right salpingo-oophorectomy presenting for definitive therapy of  abnormal uterine bleeding and pelvic pain.  Please see the patient's  dictated history and physical examination for details.   PREOPERATIVE PHYSICAL EXAMINATION:  VITAL SIGNS:  Blood pressure 110/80,  pulse 66, respirations 16, temperature 98.3, weight 164 pounds, height 5  feet 3 inches tall.  GENERAL:  Within normal limits.  PELVIC:  EGD, EUS showed miniscule, condylomata over the clitoral hood.  The vagina was without lesions.  The cervix was without lesions.  The  uterus appear upper limits of normal size, was mildly tender to motion,  and sound to 9 cm.  The adnexa were without any masses or tenderness.  RECTOVAGINAL:  Without any masses.   HOSPITAL COURSE:  On the day of admission, the patient underwent  aforementioned procedures, tolerating them all well.  Postoperative  course was marked by the patient having difficulty with pain control  utilizing Percocet; however, she found adequate relief with a  combination of Dilaudid and Toradol.  By postop day #1, the patient  had  resumed bowel and bladder habits and was tolerating a postop hemoglobin  of 11.1 (preop hemoglobin 13.1) and therefore was deemed ready for  discharge home.   DISCHARGE MEDICATIONS:  The patient was directed to her home medication  reconciliation form.  She was further prescribed amitriptyline 10 mg at  bedtime for 2 weeks, then she is to increase to 20 mg at bedtime for 2  weeks, then 25 mg at bedtime, Phenergan suppositories 25 mg every 6  hours as needed for nausea, ibuprofen 600 mg every 6 hours for 5 days,  then as needed for pain, Dilaudid 2 mg 1-2 tablets every 4 hours as  needed for pain.   FOLLOW UP:  The patient is to schedule a 6 weeks postoperative visit  with Dr. Pennie Rushing.   DISCHARGE INSTRUCTIONS:  The patient was given a copy of the booklet  entitled recovering from your surgery.  She was further advised to avoid  driving for 2 weeks, lifting over 5 pounds for 6 weeks, sexual activity  for 6 weeks, but she may shower.  Her diet was without restriction.   FINAL PATHOLOGY:  Left ovarian cyst excision:  Cystic follicle and  malignant features were not  identified.  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      [place]mira J. Adline Peals.      Hal Morales, M.D.  Electronically Signed    EJP/MEDQ  D:  10/18/2008  T:  10/19/2008  Job:  [id number]

## 2011-03-14 ENCOUNTER — Emergency Department (HOSPITAL_COMMUNITY)
Admission: EM | Admit: 2011-03-14 | Discharge: 2011-03-14 | Disposition: A | Discharge status: Home / Self Care | Payer: Self-pay | Attending: Emergency Medicine | Admitting: Emergency Medicine

## 2011-03-14 ENCOUNTER — Emergency Department (HOSPITAL_COMMUNITY): Payer: Self-pay

## 2011-03-14 ENCOUNTER — Encounter: Payer: Self-pay | Admitting: *Deleted

## 2011-03-14 DIAGNOSIS — J189 Pneumonia, unspecified organism: Secondary | ICD-10-CM | POA: Insufficient documentation

## 2011-03-14 DIAGNOSIS — J45909 Unspecified asthma, uncomplicated: Secondary | ICD-10-CM | POA: Insufficient documentation

## 2011-03-14 DIAGNOSIS — F17200 Nicotine dependence, unspecified, uncomplicated: Secondary | ICD-10-CM | POA: Insufficient documentation

## 2011-03-14 MED ORDER — HYDROCODONE-ACETAMINOPHEN 5-325 MG PO TABS
1.0000 | ORAL_TABLET | Freq: Once | ORAL | Status: AC
  Administered 2011-03-14: 1 via ORAL
  Filled 2011-03-14: qty 1

## 2011-03-14 MED ORDER — HYDROCODONE-ACETAMINOPHEN 5-325 MG PO TABS: 1.0000 | ORAL_TABLET | Freq: Once | ORAL | Status: AC

## 2011-03-14 MED ORDER — BENZONATATE 100 MG PO CAPS
200.0000 mg | ORAL_CAPSULE | Freq: Once | ORAL | Status: AC
  Administered 2011-03-14: 200 mg via ORAL
  Filled 2011-03-14: qty 2

## 2011-03-14 MED ORDER — CEFTRIAXONE SODIUM 1 G IJ SOLR
INTRAMUSCULAR | Status: AC
  Filled 2011-03-14: qty 1

## 2011-03-14 MED ORDER — AZITHROMYCIN 250 MG PO TABS: ORAL_TABLET | ORAL | Status: AC

## 2011-03-14 MED ORDER — BENZONATATE 200 MG PO CAPS: 200.0000 mg | ORAL_CAPSULE | Freq: Two times a day (BID) | ORAL | Status: AC | PRN

## 2011-03-14 MED ORDER — AZITHROMYCIN 250 MG PO TABS
500.0000 mg | ORAL_TABLET | Freq: Once | ORAL | Status: AC
  Administered 2011-03-14: 500 mg via ORAL
  Filled 2011-03-14: qty 2

## 2011-03-14 MED ORDER — LIDOCAINE HCL (PF) 1 % IJ SOLN
INTRAMUSCULAR | Status: AC
  Filled 2011-03-14: qty 5

## 2011-03-14 MED ORDER — CEFTRIAXONE SODIUM 250 MG IJ SOLR
1.0000 g | Freq: Once | INTRAMUSCULAR | Status: AC
  Administered 2011-03-14: 1 g via INTRAMUSCULAR

## 2011-03-14 NOTE — ED Provider Notes (Signed)
History     CSN: [id number] Arrival date & time: 03/14/2011  2:31 PM  Chief Complaint  Patient presents with  . Shortness of Breath    tightness in chest  . Cough   Patient is a 35 y.o. female presenting with shortness of breath and cough. The history is provided by the patient.  Shortness of Breath  The current episode started 5 to 7 days ago (She describes non productive cough,  occasional wheezing and increased shortness of breath which is worsened at night.). The problem occurs continuously (she spent last night mostly coughing,  rather than sleeing, and is having increased fatigue today.). The problem has been gradually worsening. The problem is moderate. The symptoms are relieved by nothing (Not relieved by otc cold and cough remedies tried.). The symptoms are aggravated by a supine position. Associated symptoms include cough, shortness of breath and wheezing. Pertinent negatives include no chest pain, no fever, no rhinorrhea and no sore throat.  Cough Associated symptoms include shortness of breath and wheezing. Pertinent negatives include no chest pain, no headaches, no rhinorrhea and no sore throat.    Past Medical History  Diagnosis Date  . Asthma     Past Surgical History  Procedure Date  . Abdominal hysterectomy   . Tubal ligation   . Appendectomy   . Tonsillectomy   . Ovary surgery     right ovary removed    History reviewed. No pertinent family history.  History  Substance Use Topics  . Smoking status: Current Everyday Smoker -- 0.5 packs/day  . Smokeless tobacco: Not on file  . Alcohol Use: No    OB History    Grav Para Term Preterm Abortions TAB SAB Ect Mult Living                  Review of Systems  Constitutional: Negative for fever.  HENT: Negative for congestion, sore throat, rhinorrhea and neck pain.   Eyes: Negative.   Respiratory: Positive for cough, shortness of breath and wheezing. Negative for chest tightness.   Cardiovascular: Negative for  chest pain.  Gastrointestinal: Negative for nausea and abdominal pain.  Genitourinary: Negative.   Musculoskeletal: Negative for joint swelling and arthralgias.  Skin: Negative.  Negative for rash and wound.  Neurological: Negative for dizziness, weakness, light-headedness, numbness and headaches.  Hematological: Negative.   Psychiatric/Behavioral: Negative.     Physical Exam  BP 168/92  Pulse 97  Temp(Src) 98.4 F (36.9 C) (Oral)  Resp 20  Ht 5\' 4"  (1.626 m)  Wt 150 lb (68.04 kg)  BMI 25.75 kg/m2  SpO2 96%  Physical Exam  Vitals reviewed. Constitutional: She is oriented to person, place, and time. She appears well-developed and well-nourished.  HENT:  Head: Normocephalic and atraumatic.  Mouth/Throat: Oropharynx is clear and moist.  Eyes: Conjunctivae are normal.  Neck: Normal range of motion.  Cardiovascular: Normal rate, regular rhythm, normal heart sounds and intact distal pulses.   Pulmonary/Chest: Effort normal and breath sounds normal. Not tachypneic. No respiratory distress. She has no wheezes.       Coarse breath sounds throughout with no wheezing, Few rhonchi which clears with cough.  Abdominal: Soft. Bowel sounds are normal. There is no tenderness.  Musculoskeletal: Normal range of motion. She exhibits no edema and no tenderness.  Neurological: She is alert and oriented to person, place, and time.  Skin: Skin is warm and dry.  Psychiatric: She has a normal mood and affect.    ED Course  Procedures  Patient given rocephin 1 gram IM,  zithromax 500 mg po,  Tessalon 200 mg po,  Hydrocodone 1 tab po.  MDM Patients labs and/or radiological studies were reviewed during the medical decision making and disposition process.      Candis Musa, PA 03/14/11 1549

## 2011-03-14 NOTE — ED Notes (Signed)
Patient to xray at this time via wheelchair.

## 2011-03-14 NOTE — ED Notes (Signed)
Patient reports shortness of breath and cough since Monday. Cough is non-productive and frequent. Patient denies nausea/vomiting. Reports fever at home controlled with ibuprofen and tylenol. Afebrile at present.

## 2011-03-14 NOTE — ED Notes (Signed)
Pt c/o cough, sob, chest tightness. Pt states she has been coughing for a week with sob.

## 2011-03-14 NOTE — ED Notes (Signed)
Patient with no complaints at this time. Respirations even and unlabored. Skin warm/dry. Discharge instructions reviewed with patient at this time. Patient given opportunity to voice concerns/ask questions. Patient discharged at this time and left Emergency Department with steady gait.   

## 2011-03-14 NOTE — ED Provider Notes (Signed)
Medical screening examination/treatment/procedure(s) were performed by non-physician practitioner and as supervising physician I was immediately available for consultation/collaboration.   Lyanne Co, MD 03/14/11 [phone]

## 2011-03-18 ENCOUNTER — Encounter (HOSPITAL_COMMUNITY): Payer: Self-pay | Admitting: *Deleted

## 2011-03-18 ENCOUNTER — Emergency Department (HOSPITAL_COMMUNITY): Payer: Self-pay

## 2011-03-18 ENCOUNTER — Emergency Department (HOSPITAL_COMMUNITY)
Admission: EM | Admit: 2011-03-18 | Discharge: 2011-03-18 | Disposition: A | Discharge status: Home / Self Care | Payer: Self-pay | Attending: Emergency Medicine | Admitting: Emergency Medicine

## 2011-03-18 DIAGNOSIS — J45909 Unspecified asthma, uncomplicated: Secondary | ICD-10-CM | POA: Insufficient documentation

## 2011-03-18 DIAGNOSIS — J069 Acute upper respiratory infection, unspecified: Secondary | ICD-10-CM | POA: Insufficient documentation

## 2011-03-18 DIAGNOSIS — F17200 Nicotine dependence, unspecified, uncomplicated: Secondary | ICD-10-CM | POA: Insufficient documentation

## 2011-03-18 MED ORDER — ALBUTEROL SULFATE HFA 108 (90 BASE) MCG/ACT IN AERS
2.0000 | INHALATION_SPRAY | RESPIRATORY_TRACT | Status: AC
  Administered 2011-03-18: 2 via RESPIRATORY_TRACT
  Filled 2011-03-18: qty 6.7

## 2011-03-18 MED ORDER — PREDNISONE 20 MG PO TABS
60.0000 mg | ORAL_TABLET | Freq: Once | ORAL | Status: AC
  Administered 2011-03-18: 60 mg via ORAL
  Filled 2011-03-18: qty 3

## 2011-03-18 MED ORDER — ALBUTEROL SULFATE (5 MG/ML) 0.5% IN NEBU: 5.0000 mg | INHALATION_SOLUTION | Freq: Once | RESPIRATORY_TRACT | Status: DC

## 2011-03-18 MED ORDER — IPRATROPIUM BROMIDE 0.02 % IN SOLN
0.5000 mg | Freq: Once | RESPIRATORY_TRACT | Status: AC
  Administered 2011-03-18: 0.5 mg via RESPIRATORY_TRACT
  Filled 2011-03-18: qty 2.5

## 2011-03-18 MED ORDER — ALBUTEROL SULFATE (5 MG/ML) 0.5% IN NEBU
5.0000 mg | INHALATION_SOLUTION | Freq: Once | RESPIRATORY_TRACT | Status: AC
  Administered 2011-03-18: 5 mg via RESPIRATORY_TRACT
  Filled 2011-03-18: qty 1

## 2011-03-18 MED ORDER — IPRATROPIUM BROMIDE 0.02 % IN SOLN: 0.5000 mg | Freq: Once | RESPIRATORY_TRACT | Status: DC

## 2011-03-18 MED ORDER — PREDNISONE 20 MG PO TABS: 60.0000 mg | ORAL_TABLET | Freq: Once | ORAL | Status: DC

## 2011-03-18 MED ORDER — PREDNISONE 20 MG PO TABS: 20.0000 mg | ORAL_TABLET | Freq: Every day | ORAL | Status: AC

## 2011-03-18 NOTE — ED Notes (Signed)
Pt states was here on Thursday and received a "z-pack, Vicodin, tesslan pearls, and a shot of rocephin". Pt says feels worse than when she was here on Thursday. Pt cannot sleep secondary to coughing and unable to fall asleep.   Pt denies fever/chills, nausea and vomiting.

## 2011-03-18 NOTE — ED Notes (Signed)
Pt c/o cough and congestion x 1 week. Seen in ED last Thursday and given antibiotics. Pt states that the antibiotics are finished and she doesn't feel any better.

## 2011-03-18 NOTE — ED Notes (Signed)
Pt demonstrated proper use of inhaler with prechamber. Pt self ambulated out with a steady gait stating no needs

## 2011-03-18 NOTE — ED Provider Notes (Signed)
History     CSN: [id number] Arrival date & time: 03/18/2011  2:04 PM  Chief Complaint  Patient presents with  . Cough   HPI Pt was seen at 1510.  Per pt, c/o gradual onset and persistence of intermittent "coughing" x1 week.  Pt was eval in ED 1 week ago for same, tx rocephin, zithromax and tessalon pearls without relief.  Cough has been associated with sinus congestion and runny/stuffy nose.  Symptoms are worse qhs.  Has been using her albuterol MDI prn (approx BID) only.  Denies fevers, no CP/SOB, no rash, no N/V/D, no abd pain, no back pain, no sore throat.   Past Medical History  Diagnosis Date  . Asthma     Past Surgical History  Procedure Date  . Abdominal hysterectomy   . Tubal ligation   . Appendectomy   . Tonsillectomy   . Ovary surgery     right ovary removed    History reviewed. No pertinent family history.  History  Substance Use Topics  . Smoking status: Current Everyday Smoker -- 0.5 packs/day  . Smokeless tobacco: Not on file  . Alcohol Use: No    OB History    Grav Para Term Preterm Abortions TAB SAB Ect Mult Living                  Review of Systems ROS: Statement: All systems negative except as marked or noted in the HPI; Constitutional: Negative for fever and chills. ; ; Eyes: Negative for eye pain and discharge. ; ; ENMT: Negative for ear pain, hoarseness, +nasal congestion and sinus pressure, negative for sore throat. ; ; Cardiovascular: Negative for chest pain, palpitations, diaphoresis, dyspnea and peripheral edema. ; ; Respiratory: +non-productive cough, Negative for wheezing and stridor. ; ; Gastrointestinal: Negative for nausea, vomiting, diarrhea and abdominal pain. ; ; Genitourinary: Negative for dysuria, flank pain and hematuria. ; ; Musculoskeletal: Negative for back pain and neck pain. ; ; Skin: Negative for rash and skin lesion. ; ; Neuro: Negative for headache, lightheadedness and neck stiffness.    Physical Exam  BP 166/98  Pulse 106   Temp(Src) 98.1 F (36.7 C) (Oral)  Resp 22  Ht 5\' 4"  (1.626 m)  Wt 155 lb (70.308 kg)  BMI 26.61 kg/m2  SpO2 98%  Physical Exam 1515: Physical examination:  Nursing notes reviewed; Vital signs and O2 SAT reviewed;  Constitutional: Well developed, Well nourished, Well hydrated, In no acute distress; Head:  Normocephalic, atraumatic; Eyes: EOMI, PERRL, No scleral icterus; ENMT: +clear fluid levels behind bilat clear TM's; +edemetous nasal turbinates bilat with clear rhinorrhea and post-nasal drip in post pharynx. Mouth and pharynx normal, Mucous membranes moist; Speech clear, no drooling, no hoarse voice, no stridor.  Neck: Supple, Full range of motion, No lymphadenopathy; Cardiovascular: Regular rate and rhythm, No murmur, rub, or gallop; Respiratory: No wheezing.  Speaking full sentences with ease.  Breath sounds clear & equal bilaterally, No rales, rhonchi, wheezes, or rub, Normal respiratory effort/excursion; Chest: Nontender, Movement normal;; Extremities: Pulses normal, No tenderness, No edema, No calf edema or asymmetry.; Neuro: AA&Ox3, Major CN grossly intact.  No gross focal motor or sensory deficits in extremities.; Skin: Color normal, Warm, Dry.   ED Course  Procedures  MDM MDM Reviewed: nursing note, vitals and previous chart Interpretation: x-ray   Dg Chest 2 View  03/18/2011  *RADIOLOGY REPORT*  Clinical Data: 1-week history of progressively worsening cough and shortness of breath.  CHEST - 2 VIEW 03/18/2011:  Comparison:  Two-view chest x-ray 03/14/2011 and 06/07/2005.  Findings: Suboptimal inspiration accounts for crowded bronchovascular markings on the PA image; inspiration is improved on the lateral image, and there is evidence of mild central peribronchial thickening.  Lungs otherwise clear. Cardiomediastinal silhouette unremarkable and unchanged.  No pleural effusions.  Visualized bony thorax intact.  IMPRESSION: Stable mild changes of bronchitis and/or asthma.  No acute  cardiopulmonary disease.  Original Report Authenticated By: Arnell Sieving, M.D.    5:11 PM:  Pt wants to go home now, feels "better" after neb and prednisone given.  Less coughing.  CXR without pneumonia.  Likely viral URI vs asthmatic bronchitis.  Dx testing d/w pt and family.  Questions answered.  Verb understanding, agreeable to d/c home with outpt f/u.    Sheylin Scharnhorst Allison Quarry, DO 03/19/11 1234

## 2011-04-25 LAB — COMPREHENSIVE METABOLIC PANEL
ALT: 13
AST: 12
AST: 17
Albumin: 4.2
CO2: 26
Calcium: 8.5
Calcium: 9.3
Creatinine, Ser: 0.63
GFR calc Af Amer: >60
GFR calc Af Amer: >60
GFR calc non Af Amer: >60
Potassium: 3.7
Sodium: 140
Total Protein: 6.8

## 2011-04-25 LAB — CBC
MCHC: 32.5
MCHC: 32.8
MCV: 81.6
RBC: 3.79 — ABNORMAL LOW
RBC: 4.42
RDW: 15.3
RDW: 15.5

## 2011-04-25 LAB — TRICYCLICS SCREEN, URINE: TCA Scrn: NOT DETECTED

## 2011-04-25 LAB — RAPID URINE DRUG SCREEN, HOSP PERFORMED
Amphetamines: NOT DETECTED
Barbiturates: NOT DETECTED
Benzodiazepines: POSITIVE — AB
Tetrahydrocannabinol: POSITIVE — AB

## 2011-04-25 LAB — TSH: TSH: 1.453

## 2011-04-25 LAB — DIFFERENTIAL
Eosinophils Absolute: 0.4
Eosinophils Relative: 2
Lymphs Abs: 4.6 — ABNORMAL HIGH
Monocytes Absolute: 0.9
Monocytes Relative: 6

## 2011-04-25 LAB — ACETAMINOPHEN LEVEL
Acetaminophen (Tylenol), Serum: 114.3 — ABNORMAL HIGH
Acetaminophen (Tylenol), Serum: 124.1 — ABNORMAL HIGH

## 2011-04-25 LAB — ETHANOL: Alcohol, Ethyl (B): <5

## 2013-11-04 ENCOUNTER — Emergency Department (HOSPITAL_COMMUNITY)
Admission: EM | Admit: 2013-11-04 | Discharge: 2013-11-04 | Disposition: A | Discharge status: Home / Self Care | Payer: Self-pay | Attending: Emergency Medicine | Admitting: Emergency Medicine

## 2013-11-04 ENCOUNTER — Encounter (HOSPITAL_COMMUNITY): Payer: Self-pay | Admitting: Emergency Medicine

## 2013-11-04 DIAGNOSIS — G43909 Migraine, unspecified, not intractable, without status migrainosus: Secondary | ICD-10-CM | POA: Insufficient documentation

## 2013-11-04 DIAGNOSIS — Z79899 Other long term (current) drug therapy: Secondary | ICD-10-CM | POA: Insufficient documentation

## 2013-11-04 DIAGNOSIS — F17200 Nicotine dependence, unspecified, uncomplicated: Secondary | ICD-10-CM | POA: Insufficient documentation

## 2013-11-04 DIAGNOSIS — J45909 Unspecified asthma, uncomplicated: Secondary | ICD-10-CM | POA: Insufficient documentation

## 2013-11-04 MED ORDER — DIPHENHYDRAMINE HCL 50 MG/ML IJ SOLN
25.0000 mg | Freq: Once | INTRAMUSCULAR | Status: AC
  Administered 2013-11-04: 25 mg via INTRAVENOUS
  Filled 2013-11-04: qty 1

## 2013-11-04 MED ORDER — IBUPROFEN 800 MG PO TABS: 800.0000 mg | ORAL_TABLET | Freq: Three times a day (TID) | ORAL | Status: DC | PRN

## 2013-11-04 MED ORDER — KETOROLAC TROMETHAMINE 30 MG/ML IJ SOLN
30.0000 mg | Freq: Once | INTRAMUSCULAR | Status: AC
  Administered 2013-11-04: 30 mg via INTRAVENOUS
  Filled 2013-11-04: qty 1

## 2013-11-04 MED ORDER — FENTANYL CITRATE 0.05 MG/ML IJ SOLN
100.0000 ug | Freq: Once | INTRAMUSCULAR | Status: AC
  Administered 2013-11-04: 100 ug via INTRAVENOUS
  Filled 2013-11-04: qty 2

## 2013-11-04 MED ORDER — DEXAMETHASONE SODIUM PHOSPHATE 10 MG/ML IJ SOLN
10.0000 mg | Freq: Once | INTRAMUSCULAR | Status: AC
  Administered 2013-11-04: 10 mg via INTRAVENOUS
  Filled 2013-11-04: qty 1

## 2013-11-04 MED ORDER — PROCHLORPERAZINE EDISYLATE 5 MG/ML IJ SOLN
10.0000 mg | Freq: Once | INTRAMUSCULAR | Status: AC
  Administered 2013-11-04: 10 mg via INTRAVENOUS
  Filled 2013-11-04: qty 2

## 2013-11-04 MED ORDER — SODIUM CHLORIDE 0.9 % IV BOLUS (SEPSIS)
2000.0000 mL | Freq: Once | INTRAVENOUS | Status: AC
  Administered 2013-11-04: 2000 mL via INTRAVENOUS

## 2013-11-04 NOTE — ED Notes (Signed)
Pt going home with mother and female companion.

## 2013-11-04 NOTE — ED Provider Notes (Signed)
CSN: [id number]     Arrival date & time 11/04/13  0911 History   First MD Initiated Contact with Patient 11/04/13 0914     Chief Complaint  Patient presents with  . Headache     (Consider location/radiation/quality/duration/timing/severity/associated sxs/prior Treatment) HPI: Ms. [name] [name] is a 38 year old woman with past medical history of migraines who presents to the Emergency Department with chief complaint of headaches.  She explains that she has had 3 months of a dull headache in her right temple that is always present and sometimes crescendos into a migraine with associated "seeing spots" and vomiting.  Her past migraines have usually been in ehr left temple.  During the past three weeks the pain has intensified to a constant 7/10 burning sensation.  She has associated scotoma and vomiting every few days.  Tylenol and ibuprofen alleviate the pain somewhat.  She also describes feeling a "knot" on the back of her right neck with pain when she turns her head, and that the tattoo on her right upper back, which was done last summer and had healed, has been "welting up."  She states that the area over the tattoo is also pruritic.    Past Medical History  Diagnosis Date  . Asthma    Past Surgical History  Procedure Laterality Date  . Abdominal hysterectomy    . Tubal ligation    . Appendectomy    . Tonsillectomy    . Ovary surgery      right ovary removed   No family history on file. History  Substance Use Topics  . Smoking status: Current Every Day Smoker -- 0.50 packs/day  . Smokeless tobacco: Not on file  . Alcohol Use: No   OB History   Grav Para Term Preterm Abortions TAB SAB Ect Mult Living                 Review of Systems    Allergies  Citric acid; Honey bee venom; and Sulfa antibiotics  Home Medications   Current Outpatient Rx  Name  Route  Sig  Dispense  Refill  . albuterol (VENTOLIN HFA) 108 (90 BASE) MCG/ACT inhaler   Inhalation   Inhale 2 puffs into the  lungs every 4 (four) hours as needed. FOR SHORTNESS OF BREATH          . guaifenesin (ROBITUSSIN) 100 MG/5ML syrup   Oral   Take 200 mg by mouth every 2 (two) hours as needed. For cough          . ibuprofen (ADVIL,MOTRIN) 200 MG tablet   Oral   Take 600 mg by mouth every 6 (six) hours as needed. For pain           BP 181/92  Pulse 85  Temp(Src) 97.8 F (36.6 C) (Oral)  Resp 16  Ht 5\' 4"  (1.626 m)  Wt 167 lb (75.751 kg)  BMI 28.65 kg/m2  SpO2 100% Physical Exam  Nursing note and vitals reviewed. Constitutional: She is oriented to person, place, and time. She appears well-developed and well-nourished. No distress.  HENT:  Head: Normocephalic and atraumatic.  Mouth/Throat: Oropharynx is clear and moist.  Eyes: Pupils are equal, round, and reactive to light.  Neck: Normal range of motion. Neck supple.  Pain to palpation over right occiput, neck, and upper back.  No discoloration or rash noted.  Linear raised lump palpable on right neck.  Tattoo on upper right back has small raised areas.    Cardiovascular: Normal rate, regular  rhythm and normal heart sounds.   Pulmonary/Chest: Effort normal and breath sounds normal. No respiratory distress.  Musculoskeletal: Normal range of motion.  Lymphadenopathy:    She has no cervical adenopathy.  Neurological: She is alert and oriented to person, place, and time. She has normal reflexes. No cranial nerve deficit. She exhibits normal muscle tone. Coordination normal.  Skin: Skin is warm and dry. No rash noted. No erythema.    ED Course  Procedures (including critical care time)  Patient, since emergency department with migraine, history she's a headache, intermittently over the last several months, but worse over the last 3, days.  She's noted 3, swollen areas in the right lateral neck, but no injury or trauma.  Patient has not had any fevers or decreased range of motion of her neck.  Aisha is feeling better following IV fluids and  Toradol, Reglan, and Benadryl.  Advised the patient.  Followup with neurology or a primary care Dr.  Carlyle Dolly, PA-C 11/06/13 1535

## 2013-11-04 NOTE — ED Notes (Signed)
Pt reporting migraine headache with hx of such. Also 3 small lumps to back of right side of neck, tender. Denies fever, no n/v.

## 2013-11-04 NOTE — Discharge Instructions (Signed)
Return here as needed.  Followup with a primary care Dr. or the wellness Center provided.  Increase your fluid intake

## 2013-11-04 NOTE — Discharge Planning (Signed)
[facility] [name] [name], [employer]  Spoke to patient about primary care resources and establishing care with a provider. Patient states she is employed and can obtain insurance after allotted days of work. Resource guide and my contact information was provided.

## 2013-11-04 NOTE — ED Notes (Signed)
Removed pt's IV before pt left for discharge 

## 2013-11-08 NOTE — ED Provider Notes (Signed)
Medical screening examination/treatment/procedure(s) were performed by non-physician practitioner and as supervising physician I was immediately available for consultation/collaboration.   EKG Interpretation None       Theodore Rahrig, MD 11/08/13 0911 

## 2013-11-10 ENCOUNTER — Encounter (HOSPITAL_COMMUNITY): Payer: Self-pay | Admitting: Emergency Medicine

## 2013-11-10 DIAGNOSIS — H538 Other visual disturbances: Secondary | ICD-10-CM | POA: Insufficient documentation

## 2013-11-10 DIAGNOSIS — Z79899 Other long term (current) drug therapy: Secondary | ICD-10-CM | POA: Insufficient documentation

## 2013-11-10 DIAGNOSIS — R51 Headache: Secondary | ICD-10-CM | POA: Insufficient documentation

## 2013-11-10 DIAGNOSIS — F17200 Nicotine dependence, unspecified, uncomplicated: Secondary | ICD-10-CM | POA: Insufficient documentation

## 2013-11-10 DIAGNOSIS — R112 Nausea with vomiting, unspecified: Secondary | ICD-10-CM | POA: Insufficient documentation

## 2013-11-10 DIAGNOSIS — H53149 Visual discomfort, unspecified: Secondary | ICD-10-CM | POA: Insufficient documentation

## 2013-11-10 DIAGNOSIS — J45909 Unspecified asthma, uncomplicated: Secondary | ICD-10-CM | POA: Insufficient documentation

## 2013-11-10 NOTE — ED Notes (Signed)
Pt states she has had HA for three months that once a week spike up. She was recently seen Thursday of last week. States she feels like her brain is on fire.

## 2013-11-11 ENCOUNTER — Emergency Department (HOSPITAL_COMMUNITY)
Admission: EM | Admit: 2013-11-11 | Discharge: 2013-11-11 | Disposition: A | Discharge status: Home / Self Care | Payer: Self-pay | Attending: Emergency Medicine | Admitting: Emergency Medicine

## 2013-11-11 DIAGNOSIS — R51 Headache: Secondary | ICD-10-CM

## 2013-11-11 DIAGNOSIS — R519 Headache, unspecified: Secondary | ICD-10-CM

## 2013-11-11 MED ORDER — DIPHENHYDRAMINE HCL 50 MG/ML IJ SOLN
25.0000 mg | Freq: Once | INTRAMUSCULAR | Status: AC
  Administered 2013-11-11: 25 mg via INTRAVENOUS
  Filled 2013-11-11: qty 1

## 2013-11-11 MED ORDER — SODIUM CHLORIDE 0.9 % IV BOLUS (SEPSIS)
1000.0000 mL | Freq: Once | INTRAVENOUS | Status: AC
  Administered 2013-11-11: 1000 mL via INTRAVENOUS

## 2013-11-11 MED ORDER — BUTALBITAL-APAP-CAFFEINE 50-325-40 MG PO TABS
1.0000 | ORAL_TABLET | Freq: Once | ORAL | Status: AC
  Administered 2013-11-11: 1 via ORAL

## 2013-11-11 MED ORDER — METOCLOPRAMIDE HCL 5 MG/ML IJ SOLN
10.0000 mg | Freq: Once | INTRAMUSCULAR | Status: AC
  Administered 2013-11-11: 10 mg via INTRAVENOUS
  Filled 2013-11-11: qty 2

## 2013-11-11 MED ORDER — DEXAMETHASONE SODIUM PHOSPHATE 4 MG/ML IJ SOLN
10.0000 mg | Freq: Once | INTRAMUSCULAR | Status: AC
  Administered 2013-11-11: 10 mg via INTRAVENOUS
  Filled 2013-11-11: qty 3

## 2013-11-11 NOTE — ED Notes (Signed)
fioricet came from pharmacy; given to RN

## 2013-11-11 NOTE — ED Provider Notes (Signed)
CSN: [id number]     Arrival date & time 11/10/13  2340 History   First MD Initiated Contact with Patient 11/11/13 [phone]     Chief Complaint  Patient presents with  . Migraine     (Consider location/radiation/quality/duration/timing/severity/associated sxs/prior Treatment) Patient is a 38 y.o. female presenting with migraines. The history is provided by the patient and medical records.  Migraine Associated symptoms include headaches.   This is a 38 y.o. F with PMH significant for asthma, presenting to the ED for migraine headache.  Patient states she's been having a intermittent headache for the past 3 months, states he usually becomes severe about once a week. States last night around 1700  headache got worse and has progressively gotten worse.  Endorses associated photophobia, phonophobia, nausea, vomiting, and mildly blurred vision.  Pt states she is supposed to wear contacts or glasses but is not currently.  She has never been evaluated by a neurologist and is not currently on any maintenance medications for headaches.  Denies any tinnitus, confusion, aphasia, or gait disturbance.   No fever, sweats, chills, or neck pain. Last medication taken at 2000 without improvement.  Past Medical History  Diagnosis Date  . Asthma    Past Surgical History  Procedure Laterality Date  . Abdominal hysterectomy    . Tubal ligation    . Appendectomy    . Tonsillectomy    . Ovary surgery      right ovary removed   History reviewed. No pertinent family history. History  Substance Use Topics  . Smoking status: Current Every Day Smoker -- 0.75 packs/day    Types: Cigarettes  . Smokeless tobacco: Never Used  . Alcohol Use: No   OB History   Grav Para Term Preterm Abortions TAB SAB Ect Mult Living                 Review of Systems  Eyes: Positive for photophobia.  Neurological: Positive for headaches.  All other systems reviewed and are negative.     Allergies  Citric acid; Honey bee  venom; and Sulfa antibiotics  Home Medications   Current Outpatient Rx  Name  Route  Sig  Dispense  Refill  . acetaminophen (TYLENOL) 500 MG tablet   Oral   Take 500 mg by mouth every 6 (six) hours as needed for mild pain or headache.         . ibuprofen (ADVIL,MOTRIN) 800 MG tablet   Oral   Take 1 tablet (800 mg total) by mouth every 8 (eight) hours as needed.   21 tablet   0   . albuterol (VENTOLIN HFA) 108 (90 BASE) MCG/ACT inhaler   Inhalation   Inhale 2 puffs into the lungs every 4 (four) hours as needed. FOR SHORTNESS OF BREATH           BP 173/91  Pulse 73  Temp(Src) 98.2 F (36.8 C) (Oral)  Resp 18  Ht 5\' 4"  (1.626 m)  Wt 164 lb 11.2 oz (74.707 kg)  BMI 28.26 kg/m2  SpO2 98%  Physical Exam  Nursing note and vitals reviewed. Constitutional: She is oriented to person, place, and time. She appears well-developed and well-nourished. No distress.  HENT:  Head: Normocephalic and atraumatic.  Mouth/Throat: Oropharynx is clear and moist.  Eyes: Conjunctivae and EOM are normal. Pupils are equal, round, and reactive to light.  Neck: Normal range of motion and full passive range of motion without pain. Neck supple. No rigidity.  No meningeal signs  Cardiovascular: Normal rate, regular rhythm and normal heart sounds.   Pulmonary/Chest: Effort normal and breath sounds normal. No respiratory distress. She has no wheezes.  Musculoskeletal: Normal range of motion. She exhibits no edema.  Neurological: She is alert and oriented to person, place, and time. She has normal strength. She displays no tremor. No cranial nerve deficit or sensory deficit. She displays no seizure activity.  AAOx3, answering questions appropriately; equal strength UE and LE bilaterally; CN grossly intact; moves all extremities appropriately without ataxia; no focal neuro deficits or facial asymmetry appreciated  Skin: Skin is warm and dry. She is not diaphoretic.  Psychiatric: She has a normal mood and  affect.    ED Course  Procedures (including critical care time) Labs Review Labs Reviewed - No data to display Imaging Review No results found.   EKG Interpretation None      MDM   Final diagnoses:  Headache   Intermittent headache x3 months. No fever or nuchal rigidity to suggest meningitis. Patient has no focal neuro deficits on physical exam.  Doubt SAH, ICH, TIA, or stroke.  ? Blurred vision however pt is not wearing her glasses or contact lenses as she is supposed to.  Will give migraine cocktail and reassess.  Pt re-assessed.  She is currently sleeping in bed with her boyfriend.  Pt woken up, states pain unchanged after migraine cocktail.  IV infiltrated and was removed.  Will order PO meds.  Headache improved after PO Fioricet, pt has been resting comfortably in bed.  Her neuro exam remains without focal neuro deficits.  Pt will be discharged with OP referral to neurology for further evaluation/management of her headaches.  Discussed plan with pt, she acknowledged understanding and agreed with plan of care.  Return precautions advised for new or worsening symptoms.  Garlon HatchetLisa M Nikie Cid, PA-C 11/11/13 1223

## 2013-11-11 NOTE — ED Notes (Signed)
Communication with Pharmacy. Will send med. Soon.

## 2013-11-11 NOTE — ED Notes (Signed)
Patient endorses dull headache that has persisted for approximately 3 months. Was treated about 1 week ago with similar exacerbation of headache.

## 2013-11-11 NOTE — Discharge Instructions (Signed)
May continue excedrin migraine or other over the counter medication as needed for headache. Follow up with [facility] for further evaluation of your recurrent headaches. Return to the ED for new concerns.

## 2013-11-12 NOTE — ED Provider Notes (Signed)
Medical screening examination/treatment/procedure(s) were performed by non-physician practitioner and as supervising physician I was immediately available for consultation/collaboration.   EKG Interpretation None       Sunnie NielsenBrian Gor Vestal, MD 11/12/13 [phone]

## 2013-11-18 ENCOUNTER — Encounter: Payer: Self-pay | Admitting: Neurology

## 2013-11-18 ENCOUNTER — Ambulatory Visit (INDEPENDENT_AMBULATORY_CARE_PROVIDER_SITE_OTHER): Payer: Self-pay | Admitting: Neurology

## 2013-11-18 VITALS — BP 153/100 | HR 89 | Ht 63.0 in | Wt 163.0 lb

## 2013-11-18 DIAGNOSIS — G4440 Drug-induced headache, not elsewhere classified, not intractable: Secondary | ICD-10-CM | POA: Insufficient documentation

## 2013-11-18 DIAGNOSIS — G44209 Tension-type headache, unspecified, not intractable: Secondary | ICD-10-CM | POA: Insufficient documentation

## 2013-11-18 DIAGNOSIS — T3995XA Adverse effect of unspecified nonopioid analgesic, antipyretic and antirheumatic, initial encounter: Secondary | ICD-10-CM

## 2013-11-18 DIAGNOSIS — G43019 Migraine without aura, intractable, without status migrainosus: Secondary | ICD-10-CM | POA: Insufficient documentation

## 2013-11-18 DIAGNOSIS — G4452 New daily persistent headache (NDPH): Secondary | ICD-10-CM | POA: Insufficient documentation

## 2013-11-18 MED ORDER — DIVALPROEX SODIUM ER 500 MG PO TB24: 500.0000 mg | ORAL_TABLET | Freq: Every day | ORAL | Status: AC

## 2013-11-18 MED ORDER — METHYLPREDNISOLONE 4 MG PO KIT: PACK | ORAL | Status: DC

## 2013-11-18 NOTE — Patient Instructions (Addendum)
I had a long discussion with the patient with regards to her refractory migraine headaches, analgesic rebound and recommend she discontinue ibuprofen and tylenol. I gave her samples of Relpax 40 mg tablets for symptomatic relief but to limit to not more than 2 tablets per day and not more than 2 days per week. She cannot afford a prescription for Relpax and she has no health insurance.Medrol dose pack to break persistent headache cycle. Also start Depakote ER 500 mg daily for headache prophylaxis and a Medrol dose pack to break her current headache cycle. I also advised her to do regular neck stretching exercises. She was advised to maintain a strict headache diary as well. Return for followup in 6 weeks to see Heide GuileLynn Lam, NP call earlier if necessary.  Headaches, Analgesic Rebound Analgesic agents are prescription or over-the-counter medications used to control pain, including headaches. However, overuse or misuse of theses medications can lead to rebound headaches. Rebound headaches are headaches that recur after the analgesic medication wears off. Eventually, the rebound headaches can become longlasting (chronic). If this happens, you must completely stop using analgesic medications. If not, the chronic headache is likely to continue despite the use of any other treatment. Usually when you stop taking analgesic medications, the headache may initally get worse for several days. Along with this you may experience sickness in your stomach (nausea), and you may throw up (vomit). After a period of 3 to 5 days, these symptoms begin to improve. Sometimes improvement may take longer. Eventually, the headaches will slowly improve with treatment with the right medications. Most people are able to stop using analgesic medications at home with a caregiver's supervision. But some find it difficult and may require hospitalization. Document Released: 10/12/2003 Document Revised: 10/14/2011 Document Reviewed:  02/04/2013 [facility]alaExitCare Patient Information 2014 [place], [place].  Migraine Headache A migraine headache is an intense, throbbing pain on one or both sides of your head. A migraine can last for 30 minutes to several hours. CAUSES  The exact cause of a migraine headache is not always known. However, a migraine may be caused when nerves in the brain become irritated and release chemicals that cause inflammation. This causes pain. Certain things may also trigger migraines, such as:  Alcohol.  Smoking.  Stress.  Menstruation.  Aged cheeses.  Foods or drinks that contain nitrates, glutamate, aspartame, or tyramine.  Lack of sleep.  Chocolate.  Caffeine.  Hunger.  Physical exertion.  Fatigue.  Medicines used to treat chest pain (nitroglycerine), birth control pills, estrogen, and some blood pressure medicines. SIGNS AND SYMPTOMS  Pain on one or both sides of your head.  Pulsating or throbbing pain.  Severe pain that prevents daily activities.  Pain that is aggravated by any physical activity.  Nausea, vomiting, or both.  Dizziness.  Pain with exposure to bright lights, loud noises, or activity.  General sensitivity to bright lights, loud noises, or smells. Before you get a migraine, you may get warning signs that a migraine is coming (aura). An aura may include:  Seeing flashing lights.  Seeing bright spots, halos, or zig-zag lines.  Having tunnel vision or blurred vision.  Having feelings of numbness or tingling.  Having trouble talking.  Having muscle weakness. DIAGNOSIS  A migraine headache is often diagnosed based on:  Symptoms.  Physical exam.  A CT scan or MRI of your head. These imaging tests cannot diagnose migraines, but they can help rule out other causes of headaches. TREATMENT Medicines may be given for pain and  nausea. Medicines can also be given to help prevent recurrent migraines.  HOME CARE INSTRUCTIONS  Only take over-the-counter or  prescription medicines for pain or discomfort as directed by your health care provider. The use of long-term narcotics is not recommended.  Lie down in a dark, quiet room when you have a migraine.  Keep a journal to find out what may trigger your migraine headaches. For example, write down:  What you eat and drink.  How much sleep you get.  Any change to your diet or medicines.  Limit alcohol consumption.  Quit smoking if you smoke.  Get 7 9 hours of sleep, or as recommended by your health care provider.  Limit stress.  Keep lights dim if bright lights bother you and make your migraines worse. SEEK IMMEDIATE MEDICAL CARE IF:   Your migraine becomes severe.  You have a fever.  You have a stiff neck.  You have vision loss.  You have muscular weakness or loss of muscle control.  You start losing your balance or have trouble walking.  You feel faint or pass out.  You have severe symptoms that are different from your first symptoms. MAKE SURE YOU:   Understand these instructions.  Will watch your condition.  Will get help right away if you are not doing well or get worse. Document Released: 07/22/2005 Document Revised: 05/12/2013 Document Reviewed: 03/29/2013 [facility] Patient Information 2014 [place]ExitCare, [place].

## 2013-11-18 NOTE — Progress Notes (Signed)
[facility] [address] [place] [id number] [phone]       OFFICE CONSULT NOTE  Ms. [name] LivCrystal S Mahaffey Date of Birth:  30-Jun-1976 Medical Record Number:  [id number]   Referring MD:  Sunnie NielsenBrian Opitz  Reason for Referral:  Headaches  HPI: [age] year Caucasian lady with lifelong history of migraine headaches which seem to have gotten worse in the last 3 months where she started off with her usual migraine but and it never went away and she has not daily persistent headache. She states she wakes up in a morning with a headache which is mainly in the occipital region and is mostly dull 5/inferiorly with within a few hours developed into a full-blown migraine with sharp throbbing headache 9/10 in severity he complains of nausea, vomiting, light and sound sensitivity. Headache is still lasting for hours she takes 800 mg of ibuprofen 3-4 times a day as well as Tylenol 500 mg 2-3 times per day and gets only partial relief and she is and down and go to sleep. She works mostly third shift and [place] and sleep during the day. She was seen recently in the emergency room and given Fioricet which worked only temporarily. She does remember years ago she was given Maxalt which used to work quite well but she presently does not have [employer] insurance and is unable to afford diagnostic testing on extensive medications. She does get visual symptoms prior to her headaches with tingling guidelines and patent which involved removal of the visual field. She denies focal neurological symptoms, cough vision, slurred speech, focal weakness, numbness gait or balance problems. She is unable to identify specific trigger for headache or any relieving factors. She does not admit to any significant stressors in her life. She does complain of posterior neck and shoulder pain as well as muscle tightness. She does not do regular exercises. She does have a strong family history of migraine and father  as well as heart. She has not been seen by a neurologist or had any had any brain imaging studies done. She remembers having been to headache clinic in the year 2000 but she is awake about the details do not have any prior records to review.  ROS:   14 system review of systems is positive for fatigue, headache, numbness, and not enough sleep, decreased energy and all other systems negative  PMH:  Past Medical History  Diagnosis Date  . Asthma   . Headache(784.0)     Social History:  History   Social History  . Marital Status: Married    Spouse Name: N/A    Number of Children: 4  . Years of Education: GED   Occupational History  . employed    Social History Main Topics  . Smoking status: Current Every Day Smoker -- 0.75 packs/day    Types: Cigarettes  . Smokeless tobacco: Never Used  . Alcohol Use: Yes  . Drug Use: No  . Sexual Activity: Yes   Other Topics Concern  . Not on file   Social History Narrative   Patient lives at home with her family.   Patient drinks soda daily    Medications:   Current Outpatient Prescriptions on File Prior to Visit  Medication Sig Dispense Refill  . acetaminophen (TYLENOL) 500 MG tablet Take 500 mg by mouth every 6 (six) hours as needed for mild pain or headache.      . albuterol (VENTOLIN HFA) 108 (90 BASE) MCG/ACT inhaler Inhale 2  puffs into the lungs every 4 (four) hours as needed. FOR SHORTNESS OF BREATH       . ibuprofen (ADVIL,MOTRIN) 800 MG tablet Take 1 tablet (800 mg total) by mouth every 8 (eight) hours as needed.  21 tablet  0   No current facility-administered medications on file prior to visit.    Allergies:   Allergies  Allergen Reactions  . Citric Acid Anaphylaxis  . Honey Bee Venom Anaphylaxis  . Sulfa Antibiotics Hives    Physical Exam General: well developed, well nourished, seated, in no evident distress Head: head normocephalic and atraumatic. Orohparynx benign Neck: supple with no carotid or  supraclavicular bruits Cardiovascular: regular rate and rhythm, no murmurs Musculoskeletal: no deformity. Mild tenderness and tightness posterior neck and shoulder muscles Skin:  no rash/petichiae Vascular:  Normal pulses all extremities Filed Vitals:   11/18/13 1333  BP: 153/100  Pulse: 89    Neurologic Exam Mental Status: Awake and fully alert. Oriented to place and time. Recent and remote memory intact. Attention span, concentration and fund of knowledge appropriate. Mood and affect appropriate.  Cranial Nerves: Fundoscopic exam reveals sharp disc margins. Pupils equal, briskly reactive to light. Extraocular movements full without nystagmus. Visual fields full to confrontation. Hearing intact. Facial sensation intact. Face, tongue, palate moves normally and symmetrically.  Motor: Normal bulk and tone. Normal strength in all tested extremity muscles. Sensory.: intact to touch and pinprick and vibratory sensation.  Coordination: Rapid alternating movements normal in all extremities. Finger-to-nose and heel-to-shin performed accurately bilaterally. Gait and Station: Arises from chair without difficulty. Stance is normal. Gait demonstrates normal stride length and balance . Able to heel, toe and tandem walk without difficulty.  Reflexes: 1+ and symmetric. Toes downgoing.      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    PLAN: I had a long discussion with the patient with regards to her refractory migraine headaches, analgesic rebound and recommend she discontinue ibuprofen and tylenol. I gave her samples of Relpax 40 mg tablets for symptomatic relief but to limit to not more than 2 tablets per day and not more than 2 days per week. She cannot afford a prescription for Relpax and she has no health insurance.also try  medrol dose pack to break present persistent headache cycle. Also start Depakote ER 500 mg daily for headache prophylaxis and a Medrol dose pack to break her current headache cycle. I also advised her to do regular neck stretching exercises. She was advised to maintain a strict headache diary as well. Return for followup in 6 weeks to see Heide GuileLynn Lam, NP call earlier if necessary.    Note: This document was prepared with digital dictation and possible smart phrase technology. Any transcriptional errors that result from this process are unintentional.

## 2014-02-18 ENCOUNTER — Ambulatory Visit: Payer: Self-pay | Admitting: Nurse Practitioner

## 2014-02-18 ENCOUNTER — Telehealth: Payer: Self-pay | Admitting: Nurse Practitioner

## 2014-02-18 NOTE — Telephone Encounter (Signed)
Patient was no show for today's office appointment.  

## 2015-08-13 ENCOUNTER — Emergency Department (INDEPENDENT_AMBULATORY_CARE_PROVIDER_SITE_OTHER)
Admission: EM | Admit: 2015-08-13 | Discharge: 2015-08-13 | Disposition: A | Discharge status: Home / Self Care | Payer: BLUE CROSS/BLUE SHIELD | Source: Home / Self Care | Attending: Emergency Medicine | Admitting: Emergency Medicine

## 2015-08-13 ENCOUNTER — Encounter (HOSPITAL_COMMUNITY): Payer: Self-pay | Admitting: Emergency Medicine

## 2015-08-13 DIAGNOSIS — S0502XA Injury of conjunctiva and corneal abrasion without foreign body, left eye, initial encounter: Secondary | ICD-10-CM | POA: Diagnosis not present

## 2015-08-13 MED ORDER — GENTAMICIN SULFATE 0.3 % OP SOLN: 1.0000 [drp] | OPHTHALMIC | Status: DC

## 2015-08-13 MED ORDER — TETRACAINE HCL 0.5 % OP SOLN
OPHTHALMIC | Status: AC
  Filled 2015-08-13: qty 2

## 2015-08-13 NOTE — Discharge Instructions (Signed)
Corneal Abrasion °The cornea is the clear covering at the front and center of the eye. When you look at the colored portion of the eye, you are looking through the cornea. It is a thin tissue made up of layers. The top layer is the most sensitive layer. A corneal abrasion happens if this layer is scratched or an injury causes it to come off.  °HOME CARE °· You may be given drops or a medicated cream. Use the medicine as told by your doctor. °· A pressure patch may be put over the eye. If this is done, follow your doctor's instructions for when to remove the patch. Do not drive or use machines while the eye patch is on. Judging distances is hard to do with a patch on. °· See your doctor for a follow-up exam if you are told to do so. It is very important that you keep this appointment. °GET HELP IF:  °· You have pain, are sensitive to light, and have a scratchy feeling in one eye or both eyes. °· Your pressure patch keeps getting loose. You can blink your eye under the patch. °· You have fluid coming from your eye or the lids stick together in the morning. °· You have the same symptoms in the morning that you did with the first abrasion. This could be days, weeks, or months after the first abrasion healed. °  °This information is not intended to replace advice given to you by your health care provider. Make sure you discuss any questions you have with your health care provider. °  °Document Released: 01/08/2008 Document Revised: 04/12/2015 Document Reviewed: 03/29/2013 °Elsevier Interactive Patient Education ©2016 [employer]vier [employer] ° °

## 2015-08-13 NOTE — ED Notes (Signed)
Pt reports granddaughter scratched her on the left eye about 2 hrs ago  Sx include BV, pain and watery A&O x4.... No acute distress.

## 2015-08-13 NOTE — ED Provider Notes (Signed)
CSN: [id number]     Arrival date & time 08/13/15  1530 History   First MD Initiated Contact with Patient 08/13/15 1611     Chief Complaint  Patient presents with  . Eye Injury   (Consider location/radiation/quality/duration/timing/severity/associated sxs/prior Treatment) HPI History obtained from patient:   LOCATION: left eye SEVERITY: 8 DURATION: Less than 1 hour CONTEXT: Granddaughter poked Left eye  with finger QUALITY: Severe aching MODIFYING FACTORS: Cold compresses ASSOCIATED SYMPTOMS: Tearing, photophobia, pain TIMING: Constant    Past Medical History  Diagnosis Date  . Asthma   . [id number](478.2)    Past Surgical History  Procedure Laterality Date  . Abdominal hysterectomy    . Tubal ligation    . Appendectomy    . Tonsillectomy    . Ovary surgery      right ovary removed  . Cesarean section    . Esophagogastroduodenoscopy endoscopy     Family History  Problem Relation Age of Onset  . Cancer Maternal Grandfather   . Thyroid disease Mother   . Thyroid disease Sister   . Diabetes Brother    Social History  Substance Use Topics  . Smoking status: Current Every Day Smoker -- 0.75 packs/day    Types: Cigarettes  . Smokeless tobacco: Never Used  . Alcohol Use: Yes   OB History    No data available     Review of Systems ROS +'ve left eye pain  Denies: HEADACHE, NAUSEA, ABDOMINAL PAIN, CHEST PAIN, CONGESTION, DYSURIA, SHORTNESS OF BREATH  Allergies  Citric acid; Honey bee venom; and Sulfa antibiotics  Home Medications   Prior to Admission medications   Medication Sig Start Date End Date Taking? Authorizing Provider  acetaminophen (TYLENOL) 500 MG tablet Take 500 mg by mouth every 6 (six) hours as needed for mild pain or headache.    Historical Provider, MD  albuterol (VENTOLIN HFA) 108 (90 BASE) MCG/ACT inhaler Inhale 2 puffs into the lungs every 4 (four) hours as needed. FOR SHORTNESS OF BREATH     Historical Provider, MD  divalproex (DEPAKOTE ER)  500 MG 24 hr tablet Take 1 tablet (500 mg total) by mouth daily. 11/18/13   Micki Riley, MD  gentamicin (GARAMYCIN) 0.3 % ophthalmic solution Place 1 drop into the left eye every 4 (four) hours. 08/13/15   Tharon Aquas, PA  ibuprofen (ADVIL,MOTRIN) 800 MG tablet Take 1 tablet (800 mg total) by mouth every 8 (eight) hours as needed. 11/04/13   Charlestine Night, PA-C  methylPREDNISolone (MEDROL DOSEPAK) 4 MG tablet follow package directions 11/18/13   Micki Riley, MD   Meds Ordered and Administered this Visit  Medications - No data to display  BP 173/102 mmHg  Pulse 77  Temp(Src) 97.8 F (36.6 C) (Oral)  Resp 20  SpO2 97% No data found.   Physical Exam  Constitutional: She appears well-developed and well-nourished. She appears distressed.  HENT:  Head: Normocephalic and atraumatic.  Eyes: Left conjunctiva is injected. Left eye exhibits normal extraocular motion and no nystagmus. Left pupil is round and reactive.  Slit lamp exam:      The left eye shows corneal abrasion and fluorescein uptake.    Nursing note and vitals reviewed.   ED Course  Procedures (including critical care time)  Labs Review Labs Reviewed - No data to display  Imaging Review No results found.   Visual Acuity Review  Right Eye Distance: 20/70 - did not have glasses on  Left Eye Distance: unable to obtain Bilateral Distance:  Right Eye Near:   Left Eye Near:    Bilateral Near:         MDM   1. Corneal abrasion, left, initial encounter     Patient is advised to continue home symptomatic treatment. Prescription for Garamycin on, drops sent pharmacy patient has indicated. Patient is advised that if there are new or worsening symptoms or attend the emergency department, or contact primary care provider. Instructions of care provided discharged home in stable condition. Patient is referred to Dr. Alden HippGrote if symptoms are not improved in 2 days. THIS NOTE WAS GENERATED USING A VOICE  RECOGNITION SOFTWARE PROGRAM. ALL REASONABLE EFFORTS  WERE MADE TO PROOFREAD THIS DOCUMENT FOR ACCURACY.     Tharon AquasFrank C Patrik Turnbaugh, PA 08/13/15 1727

## 2015-09-12 ENCOUNTER — Emergency Department
Admission: EM | Admit: 2015-09-12 | Discharge: 2015-09-12 | Disposition: A | Discharge status: Home / Self Care | Payer: BLUE CROSS/BLUE SHIELD | Attending: Emergency Medicine | Admitting: Emergency Medicine

## 2015-09-12 ENCOUNTER — Encounter: Payer: Self-pay | Admitting: Emergency Medicine

## 2015-09-12 ENCOUNTER — Emergency Department: Payer: BLUE CROSS/BLUE SHIELD

## 2015-09-12 DIAGNOSIS — R0789 Other chest pain: Secondary | ICD-10-CM | POA: Insufficient documentation

## 2015-09-12 DIAGNOSIS — F17210 Nicotine dependence, cigarettes, uncomplicated: Secondary | ICD-10-CM | POA: Diagnosis not present

## 2015-09-12 DIAGNOSIS — Z79899 Other long term (current) drug therapy: Secondary | ICD-10-CM | POA: Diagnosis not present

## 2015-09-12 DIAGNOSIS — Z792 Long term (current) use of antibiotics: Secondary | ICD-10-CM | POA: Insufficient documentation

## 2015-09-12 DIAGNOSIS — R079 Chest pain, unspecified: Secondary | ICD-10-CM | POA: Diagnosis present

## 2015-09-12 LAB — CBC
HEMATOCRIT: 43.4 % (ref 35.0–47.0)
Hemoglobin: 14.6 g/dL (ref 12.0–16.0)
MCH: 29.4 pg (ref 26.0–34.0)
MCHC: 33.6 g/dL (ref 32.0–36.0)
MCV: 87.6 fL (ref 80.0–100.0)
Platelets: 340 10*3/uL (ref 150–440)
RBC: 4.96 MIL/uL (ref 3.80–5.20)
RDW: 12.6 % (ref 11.5–14.5)
WBC: 11 10*3/uL (ref 3.6–11.0)

## 2015-09-12 LAB — FIBRIN DERIVATIVES D-DIMER (ARMC ONLY): Fibrin derivatives D-dimer (ARMC): 158 (ref 0–499)

## 2015-09-12 LAB — BASIC METABOLIC PANEL
ANION GAP: 7 (ref 5–15)
BUN: 12 mg/dL (ref 6–20)
CALCIUM: 9.7 mg/dL (ref 8.9–10.3)
CHLORIDE: 101 mmol/L (ref 101–111)
CO2: 26 mmol/L (ref 22–32)
Creatinine, Ser: 0.58 mg/dL (ref 0.44–1.00)
GFR calc non Af Amer: >60 mL/min (ref >60)
GLUCOSE: 131 mg/dL — AB (ref 65–99)
Potassium: 3.9 mmol/L (ref 3.5–5.1)
Sodium: 134 mmol/L — ABNORMAL LOW (ref 135–145)

## 2015-09-12 LAB — TROPONIN I: Troponin I: 0.03 ng/mL (ref <0.031)

## 2015-09-12 MED ORDER — KETOROLAC TROMETHAMINE 30 MG/ML IJ SOLN
30.0000 mg | Freq: Once | INTRAMUSCULAR | Status: AC
  Administered 2015-09-12: 30 mg via INTRAMUSCULAR
  Filled 2015-09-12: qty 1

## 2015-09-12 MED ORDER — NAPROXEN 500 MG PO TABS: 500.0000 mg | ORAL_TABLET | Freq: Two times a day (BID) | ORAL | Status: DC

## 2015-09-12 NOTE — ED Notes (Signed)
Pt reports new onset chest pain. 8/10. Constant and hurts more with exertion.

## 2015-09-12 NOTE — Discharge Instructions (Signed)

## 2015-09-12 NOTE — ED Notes (Signed)
Pt to ed with c/o chest pain that started this am,  States she feels dizzy and lightheaded, also reports sob and radiation of pain to her back.  EKG done on arrival and shown to Dr. Demetrius Charity,

## 2015-09-12 NOTE — ED Provider Notes (Signed)
[facility] Emergency Department Provider Note  ____________________________________________  Time seen: 4:20 PM  I have reviewed the triage vital signs and the nursing notes.   HISTORY  Chief Complaint Chest Pain    HPI [name] [name] is a 40 y.o. female who complains of right-sided chest pain that started this morning as she was rolling on her side and getting out of bed. It is sharp, worse with breathing, radiates to her back. She doesn't necessarily feel short of breath but just that it hurts more to breathe. No diaphoresis or vomiting. Worse with movement.  No recent travel, trauma, hospitalizations or surgeries. Positive smoker. No exogenous estrogen use. No history of DVT or PE. No history or family history of bleeding or clotting disorders..   Past Medical History  Diagnosis Date  . Asthma   . ZOXWRUEA(540.9)      Patient Active Problem List   Diagnosis Date Noted  . Migraine without aura, with intractable migraine, so stated, without mention of status migrainosus 11/18/2013  . New persistent daily headache 11/18/2013  . Tension headache 11/18/2013  . 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 Abdominal hysterectomy    . Tubal ligation    . Appendectomy    . Tonsillectomy    . Ovary surgery      right ovary removed  . Cesarean section    . Esophagogastroduodenoscopy endoscopy       Current Outpatient Rx  Name  Route  Sig  Dispense  Refill  . acetaminophen (TYLENOL) 500 MG tablet   Oral   Take 500 mg by mouth every 6 (six) hours as needed for mild pain or headache.         . albuterol (VENTOLIN HFA) 108 (90 BASE) MCG/ACT inhaler   Inhalation   Inhale 2 puffs into the lungs every 4 (four) hours as needed. FOR SHORTNESS OF BREATH          . divalproex (DEPAKOTE ER) 500 MG 24 hr tablet   Oral   Take 1 tablet (500 mg total) by mouth daily.   30 tablet   3   .  gentamicin (GARAMYCIN) 0.3 % ophthalmic solution   Left Eye   Place 1 drop into the left eye every 4 (four) hours.   5 mL   0   . ibuprofen (ADVIL,MOTRIN) 800 MG tablet   Oral   Take 1 tablet (800 mg total) by mouth every 8 (eight) hours as needed.   21 tablet   0   . methylPREDNISolone (MEDROL DOSEPAK) 4 MG tablet      follow package directions   21 tablet   0   . naproxen (NAPROSYN) 500 MG tablet   Oral   Take 1 tablet (500 mg total) by mouth 2 (two) times daily with a meal.   20 tablet   0      Allergies Citric acid; Honey bee venom; and Sulfa antibiotics   Family History  Problem Relation Age of Onset  . Cancer Maternal Grandfather   . Thyroid disease Mother   . Thyroid disease Sister   . Diabetes Brother     Social History Social History  Substance Use Topics  . Smoking status: Current Every Day Smoker -- 0.75 packs/day    Types: Cigarettes  . Smokeless tobacco: Never Used  . Alcohol Use: Yes    Review of Systems  Constitutional:   No fever or chills. No  weight changes Eyes:   No blurry vision or double vision.  ENT:   No sore throat. Cardiovascular:   Positive as above chest pain. Respiratory:   No dyspnea or cough. Gastrointestinal:   Negative for abdominal pain, vomiting and diarrhea.  No BRBPR or melena. Genitourinary:   Negative for dysuria, urinary retention, bloody urine, or difficulty urinating. Musculoskeletal:   Negative for back pain. No joint swelling or pain. Skin:   Negative for rash. Neurological:   Negative for headaches, focal weakness or numbness. Psychiatric:  No anxiety or depression.   Endocrine:  No hot/cold intolerance, changes in energy, or sleep difficulty.  10-point ROS otherwise negative.  ____________________________________________   PHYSICAL EXAM:  VITAL SIGNS: ED Triage Vitals  Enc Vitals Group     BP 09/12/15 1417 121/76 mmHg     Pulse Rate 09/12/15 1417 101     Resp 09/12/15 1417 18     Temp 09/12/15  1417 97.8 F (36.6 C)     Temp Source 09/12/15 1417 Oral     SpO2 09/12/15 1417 100 %     Weight 09/12/15 1417 168 lb (76.204 kg)     Height 09/12/15 1417 5\' 2"  (1.575 m)     Head Cir --      Peak Flow --      Pain Score 09/12/15 1418 6     Pain Loc --      Pain Edu? --      Excl. in GC? --     Vital signs reviewed, nursing assessments reviewed.   Constitutional:   Alert and oriented. Well appearing and in no distress. Eyes:   No scleral icterus. No conjunctival pallor. PERRL. EOMI ENT   Head:   Normocephalic and atraumatic.   Nose:   No congestion/rhinnorhea. No septal hematoma   Mouth/Throat:   MMM, no pharyngeal erythema. No peritonsillar mass. No uvula shift.   Neck:   No stridor. No SubQ emphysema. No meningismus. Hematological/Lymphatic/Immunilogical:   No cervical lymphadenopathy. Cardiovascular:   RRR hr 70. Normal and symmetric distal pulses are present in all extremities. No murmurs, rubs, or gallops. Respiratory:   Normal respiratory effort without tachypnea nor retractions. Breath sounds are clear and equal bilaterally. No wheezes/rales/rhonchi. Gastrointestinal:   Soft and nontender. No distention. There is no CVA tenderness.  No rebound, rigidity, or guarding. Genitourinary:   deferred Musculoskeletal:   Nontender with normal range of motion in all extremities. No joint effusions.  No lower extremity tenderness.  No edema. Chest wall exquisitely tender over the right upper chest and right upper back reproducing her pain. Chest wall is stable Neurologic:   Normal speech and language.  CN 2-10 normal. Motor grossly intact. No pronator drift.  Normal gait. No gross focal neurologic deficits are appreciated.  Skin:    Skin is warm, dry and intact. No rash noted.  No petechiae, purpura, or bullae. Psychiatric:   Mood and affect are normal. Speech and behavior are normal. Patient exhibits appropriate insight and  judgment.  ____________________________________________    LABS (pertinent positives/negatives) (all labs ordered are listed, but only abnormal results are displayed) Labs Reviewed  BASIC METABOLIC PANEL - Abnormal; Notable for the following:    Sodium 134 (*)    Glucose, Bld 131 (*)    All other components within normal limits  TROPONIN I  CBC  FIBRIN DERIVATIVES D-DIMER [facility] ONLY)   ____________________________________________   EKG  Interpreted by me  Date: 09/12/2015  Rate: 98  Rhythm: normal sinus  rhythm  QRS Axis: normal  Intervals: normal  ST/T Wave abnormalities: normal  Conduction Disutrbances: none  Narrative Interpretation: unremarkable      ____________________________________________    RADIOLOGY  Chest x-ray unremarkable  ____________________________________________   PROCEDURES   ____________________________________________   INITIAL IMPRESSION / ASSESSMENT AND PLAN / ED COURSE  Pertinent labs & imaging results that were available during my care of the patient were reviewed by me and considered in my medical decision making (see chart for details).  Patient presents with pleuritic chest pain that started this morning. Exam suggests chest wall pain possibly an intercostal strain. Absent any other convincing diagnosis, we'll send a d-dimer. She has perk negative and have low suspicion for PE, so d-dimer is low we can discharge her without a CT scan.  ----------------------------------------- 6:19 PM on 09/12/2015 -----------------------------------------  D-dimer within normal limits. Pain improved. Vitals remained stable and normal. We'll discharge home with NSAIDs, follow-up with primary care.     ____________________________________________   FINAL CLINICAL IMPRESSION(S) / ED DIAGNOSES  Final diagnoses:  Chest wall pain      Sharman Cheek, MD 09/12/15 1820

## 2016-07-01 ENCOUNTER — Other Ambulatory Visit: Payer: Self-pay | Admitting: Nurse Practitioner

## 2016-07-01 DIAGNOSIS — Z1231 Encounter for screening mammogram for malignant neoplasm of breast: Secondary | ICD-10-CM

## 2016-07-24 ENCOUNTER — Ambulatory Visit
Admission: RE | Admit: 2016-07-24 | Discharge: 2016-07-24 | Disposition: A | Discharge status: Home / Self Care | Payer: BLUE CROSS/BLUE SHIELD | Source: Ambulatory Visit | Attending: Nurse Practitioner | Admitting: Nurse Practitioner

## 2016-07-24 DIAGNOSIS — Z1231 Encounter for screening mammogram for malignant neoplasm of breast: Secondary | ICD-10-CM

## 2017-02-13 ENCOUNTER — Emergency Department (HOSPITAL_COMMUNITY)
Admission: EM | Admit: 2017-02-13 | Discharge: 2017-02-13 | Disposition: A | Discharge status: Home / Self Care | Payer: BLUE CROSS/BLUE SHIELD | Attending: Emergency Medicine | Admitting: Emergency Medicine

## 2017-02-13 ENCOUNTER — Encounter (HOSPITAL_COMMUNITY): Payer: Self-pay | Admitting: Nurse Practitioner

## 2017-02-13 DIAGNOSIS — I10 Essential (primary) hypertension: Secondary | ICD-10-CM | POA: Insufficient documentation

## 2017-02-13 DIAGNOSIS — F17210 Nicotine dependence, cigarettes, uncomplicated: Secondary | ICD-10-CM | POA: Insufficient documentation

## 2017-02-13 DIAGNOSIS — J45909 Unspecified asthma, uncomplicated: Secondary | ICD-10-CM | POA: Insufficient documentation

## 2017-02-13 DIAGNOSIS — Z79899 Other long term (current) drug therapy: Secondary | ICD-10-CM | POA: Insufficient documentation

## 2017-02-13 DIAGNOSIS — Z91030 Bee allergy status: Secondary | ICD-10-CM | POA: Insufficient documentation

## 2017-02-13 LAB — CBC
HCT: 40.2 % (ref 36.0–46.0)
Hemoglobin: 13.7 g/dL (ref 12.0–15.0)
MCH: 30.4 pg (ref 26.0–34.0)
MCHC: 34.1 g/dL (ref 30.0–36.0)
MCV: 89.1 fL (ref 78.0–100.0)
PLATELETS: 336 10*3/uL (ref 150–400)
RBC: 4.51 MIL/uL (ref 3.87–5.11)
RDW: 12.5 % (ref 11.5–15.5)
WBC: 11.9 10*3/uL — ABNORMAL HIGH (ref 4.0–10.5)

## 2017-02-13 LAB — I-STAT TROPONIN, ED: Troponin i, poc: 0 ng/mL (ref 0.00–0.08)

## 2017-02-13 LAB — BASIC METABOLIC PANEL
Anion gap: 8 (ref 5–15)
CHLORIDE: 106 mmol/L (ref 101–111)
CO2: 25 mmol/L (ref 22–32)
CREATININE: 0.69 mg/dL (ref 0.44–1.00)
Calcium: 9.8 mg/dL (ref 8.9–10.3)
GFR calc Af Amer: >60 mL/min (ref >60)
GFR calc non Af Amer: >60 mL/min (ref >60)
Glucose, Bld: 95 mg/dL (ref 65–99)
Potassium: 3.6 mmol/L (ref 3.5–5.1)
SODIUM: 139 mmol/L (ref 135–145)

## 2017-02-13 LAB — I-STAT BETA HCG BLOOD, ED (MC, WL, AP ONLY): I-stat hCG, quantitative: 5.8 m[IU]/mL — ABNORMAL HIGH (ref <5)

## 2017-02-13 MED ORDER — SODIUM CHLORIDE 0.9 % IV BOLUS (SEPSIS)
1000.0000 mL | Freq: Once | INTRAVENOUS | Status: AC
  Administered 2017-02-13: 1000 mL via INTRAVENOUS

## 2017-02-13 NOTE — ED Provider Notes (Signed)
[facility] DEPT Provider Note   CSN: [id number] Arrival date & time: 02/13/17  1550     History   Chief Complaint Chief Complaint  Patient presents with  . Dizziness    HPI [name] [name] is a 41 y.o. female.  The history is provided by the patient.  Illness  This is a new problem. The current episode started 1 to 2 hours ago. The problem occurs rarely. The problem has been resolved. Pertinent negatives include no chest pain, no abdominal pain, no headaches and no shortness of breath. Associated symptoms comments: Lightheadedness, nausea, presyncope.. Nothing aggravates the symptoms. The symptoms are relieved by lying down.    Past Medical History:  Diagnosis Date  . Asthma   . NWGNFAOZ(308.6Headache(784.0)     Patient Active Problem List   Diagnosis Date Noted  . Migraine without aura, with intractable migraine, so stated, without mention of status migrainosus 11/18/2013  . New persistent daily headache 11/18/2013  . Tension headache 11/18/2013  . Analgesic rebound headache 11/18/2013    Past Surgical History:  Procedure Laterality Date  . ABDOMINAL HYSTERECTOMY    . APPENDECTOMY    . CESAREAN SECTION    . ESOPHAGOGASTRODUODENOSCOPY ENDOSCOPY    . OVARY SURGERY     right ovary removed  . TONSILLECTOMY    . TUBAL LIGATION      OB History    Gravida Para Term Preterm AB Living   4       1 4    SAB TAB Ectopic Multiple Live Births   1               Home Medications    Prior to Admission medications   Medication Sig Start Date End Date Taking? Authorizing Provider  acetaminophen (TYLENOL) 500 MG tablet Take 500 mg by mouth every 6 (six) hours as needed for mild pain or headache.   Yes [provider]  albuterol (VENTOLIN HFA) 108 (90 BASE) MCG/ACT inhaler Inhale 2 puffs into the lungs every 4 (four) hours as needed. FOR SHORTNESS OF BREATH    Yes [provider]  divalproex (DEPAKOTE ER) 500 MG 24 hr tablet Take 1 tablet (500 mg total) by  mouth daily. Patient not taking: Reported on 02/13/2017 11/18/13   Micki RileySethi, Pramod S, MD    Family History Family History  Problem Relation Age of Onset  . Thyroid disease Mother   . Cancer Maternal Grandfather   . Thyroid disease Sister   . Diabetes Brother     Social History Social History  Substance Use Topics  . Smoking status: Current Every Day Smoker    Packs/day: 0.75    Types: Cigarettes  . Smokeless tobacco: Never Used  . Alcohol use Yes     Allergies   Citric acid; Honey bee venom; and Sulfa antibiotics   Review of Systems Review of Systems  Constitutional: Negative for chills and fever.  HENT: Negative for ear pain and sore throat.   Eyes: Negative for pain and visual disturbance.  Respiratory: Negative for cough and shortness of breath.   Cardiovascular: Negative for chest pain and palpitations.  Gastrointestinal: Positive for nausea. Negative for abdominal pain, diarrhea and vomiting.  Genitourinary: Negative for dysuria and hematuria.  Musculoskeletal: Negative for arthralgias and back pain.  Skin: Negative for color change and rash.  Neurological: Negative for seizures, syncope and headaches.       Lightheadedness  All other systems reviewed and are negative.    Physical Exam Updated Vital Signs  BP (!) 130/91   Pulse 80   Temp 98.5 F (36.9 C) (Oral)   Resp (!) 23   Ht 5\' 4"  (1.626 m)   Wt 81.6 kg (180 lb)   SpO2 100%   BMI 30.90 kg/m   Physical Exam  Constitutional: She is oriented to person, place, and time. She appears well-developed and well-nourished. No distress.  HENT:  Head: Normocephalic and atraumatic.  Mouth/Throat: Oropharynx is clear and moist.  Eyes: Pupils are equal, round, and reactive to light. Conjunctivae and EOM are normal.  Neck: Normal range of motion. Neck supple. No tracheal deviation present.  Cardiovascular: Normal rate, regular rhythm, normal heart sounds and intact distal pulses.   No murmur  heard. Pulmonary/Chest: Effort normal and breath sounds normal. No respiratory distress.  Abdominal: Soft. She exhibits no distension. There is no tenderness. There is no rebound and no guarding.  Musculoskeletal: She exhibits no edema.  Neurological: She is alert and oriented to person, place, and time. No cranial nerve deficit or sensory deficit. She exhibits normal muscle tone. Coordination normal.  Skin: Skin is warm and dry.  Psychiatric: She has a normal mood and affect.  Nursing note and vitals reviewed.    ED Treatments / Results  Labs (all labs ordered are listed, but only abnormal results are displayed) Labs Reviewed  BASIC METABOLIC PANEL - Abnormal; Notable for the following:       Result Value   BUN <5 (*)    All other components within normal limits  CBC - Abnormal; Notable for the following:    WBC 11.9 (*)    All other components within normal limits  I-STAT BETA HCG BLOOD, ED ([facility], [facility], [facility] ONLY) - Abnormal; Notable for the following:    I-stat hCG, quantitative 5.8 (*)    All other components within normal limits  I-STAT TROPOININ, ED    EKG  EKG Interpretation  Date/Time:  Thursday February 13 2017 16:31:08 EDT Ventricular Rate:  77 PR Interval:    QRS Duration: 83 QT Interval:  410 QTC Calculation: 464 R Axis:   29 Text Interpretation:  Sinus rhythm No significant change since last tracing Confirmed by Frederick Peers [phone]) on 02/13/2017 4:57:24 PM       Radiology No results found.  Procedures Procedures (including critical care time)  Medications Ordered in ED Medications  sodium chloride 0.9 % bolus 1,000 mL (1,000 mLs Intravenous Transfusing/Transfer 02/13/17 1755)     Initial Impression / Assessment and Plan / ED Course  I have reviewed the triage vital signs and the nursing notes.  Pertinent labs & imaging results that were available during my care of the patient were reviewed by me and considered in my medical decision making (see chart for  details).  Clinical Course as of Feb 13 1845  Thu Feb 13, 2017  1830 I-stat hCG, quantitative: (!) 5.8 [AC]    Clinical Course User Index [AC] Orson Slick, MD    [name] [name] is a 41 year old female with history of high blood pressure coming in today with an episode of lightheadedness. Patient states she was at work standing when she all of a sudden felt lightheaded, saw stars and had some nausea and felt like she was going to pass out. No vomiting, diaphoresis. No chest pain during this time. States she called EMS and she said her initial blood pressure was in the 240s over 120s. States she does have a history of hypertension but is not taking medication for it.  Denies any vision changes, making normal amount of urine. No headaches.  Blood pressure readings here have continued to decrease into a normal range. A focal neurological exam. Lab work including creatinine unremarkable. EKG reassuring with no ST segment elevations or other signs of acute ischemia and her Troponin negative. Doubt ACS, PE, hypertensive emergency. Likely hypertension associated with medication noncompliance. Encouraged to follow up with her primary care provider tomorrow or early next week for recheck. Do not believe any blood pressure medication necessary at this time as she is currently normotensive. I Do not believe any additional imaging necessary. Patient discharged in stable condition and given strict return precautions which she voiced understanding and agreement to the plan was comfortable with outpatient management.  Patient was seen with my attending, Dr. Clarene Duke, who voiced agreement and oversaw the evaluation and treatment of this patient.   Dr[employer] illustrator was used in the creation of this note. If there are any errors or inconsistencies needing clarification, please contact me directly.   Final Clinical Impressions(s) / ED Diagnoses   Final diagnoses:  Hypertension, unspecified type      New Prescriptions New Prescriptions   No medications on file     Orson Slick, MD 02/13/17 2354    Little, Ambrose Finland, MD 02/19/17 1743

## 2017-02-13 NOTE — ED Triage Notes (Signed)
Per EMS pt from work was in the kitchen and felt sudden onset of dizziness, shaking, severe nausea and near syncopal. Upon EMS arrival patient sitting in chair. Also c/o mild headache x 2 days. Patient has hx of migraines and doesn't feel like it. Patient states has hx of htn but stopped taking meds due to her bp getting too low. No neuro deficits noted.   EMS administered 8mg  IV zofran.

## 2017-05-17 IMAGING — CR DG CHEST 2V
1 series · 2 of 2 positions shown · non-contrast
Comparison: None in PACs

CLINICAL DATA: Chest pain posterior to the right breast radiating
into the back, onset of shortness of breath today. History of
asthma, current smoker.

EXAM:
CHEST  2 VIEW

[Series 1: w chest pa · 0.14mm/px · 2 of 2 slices shown]
[im 1/2]
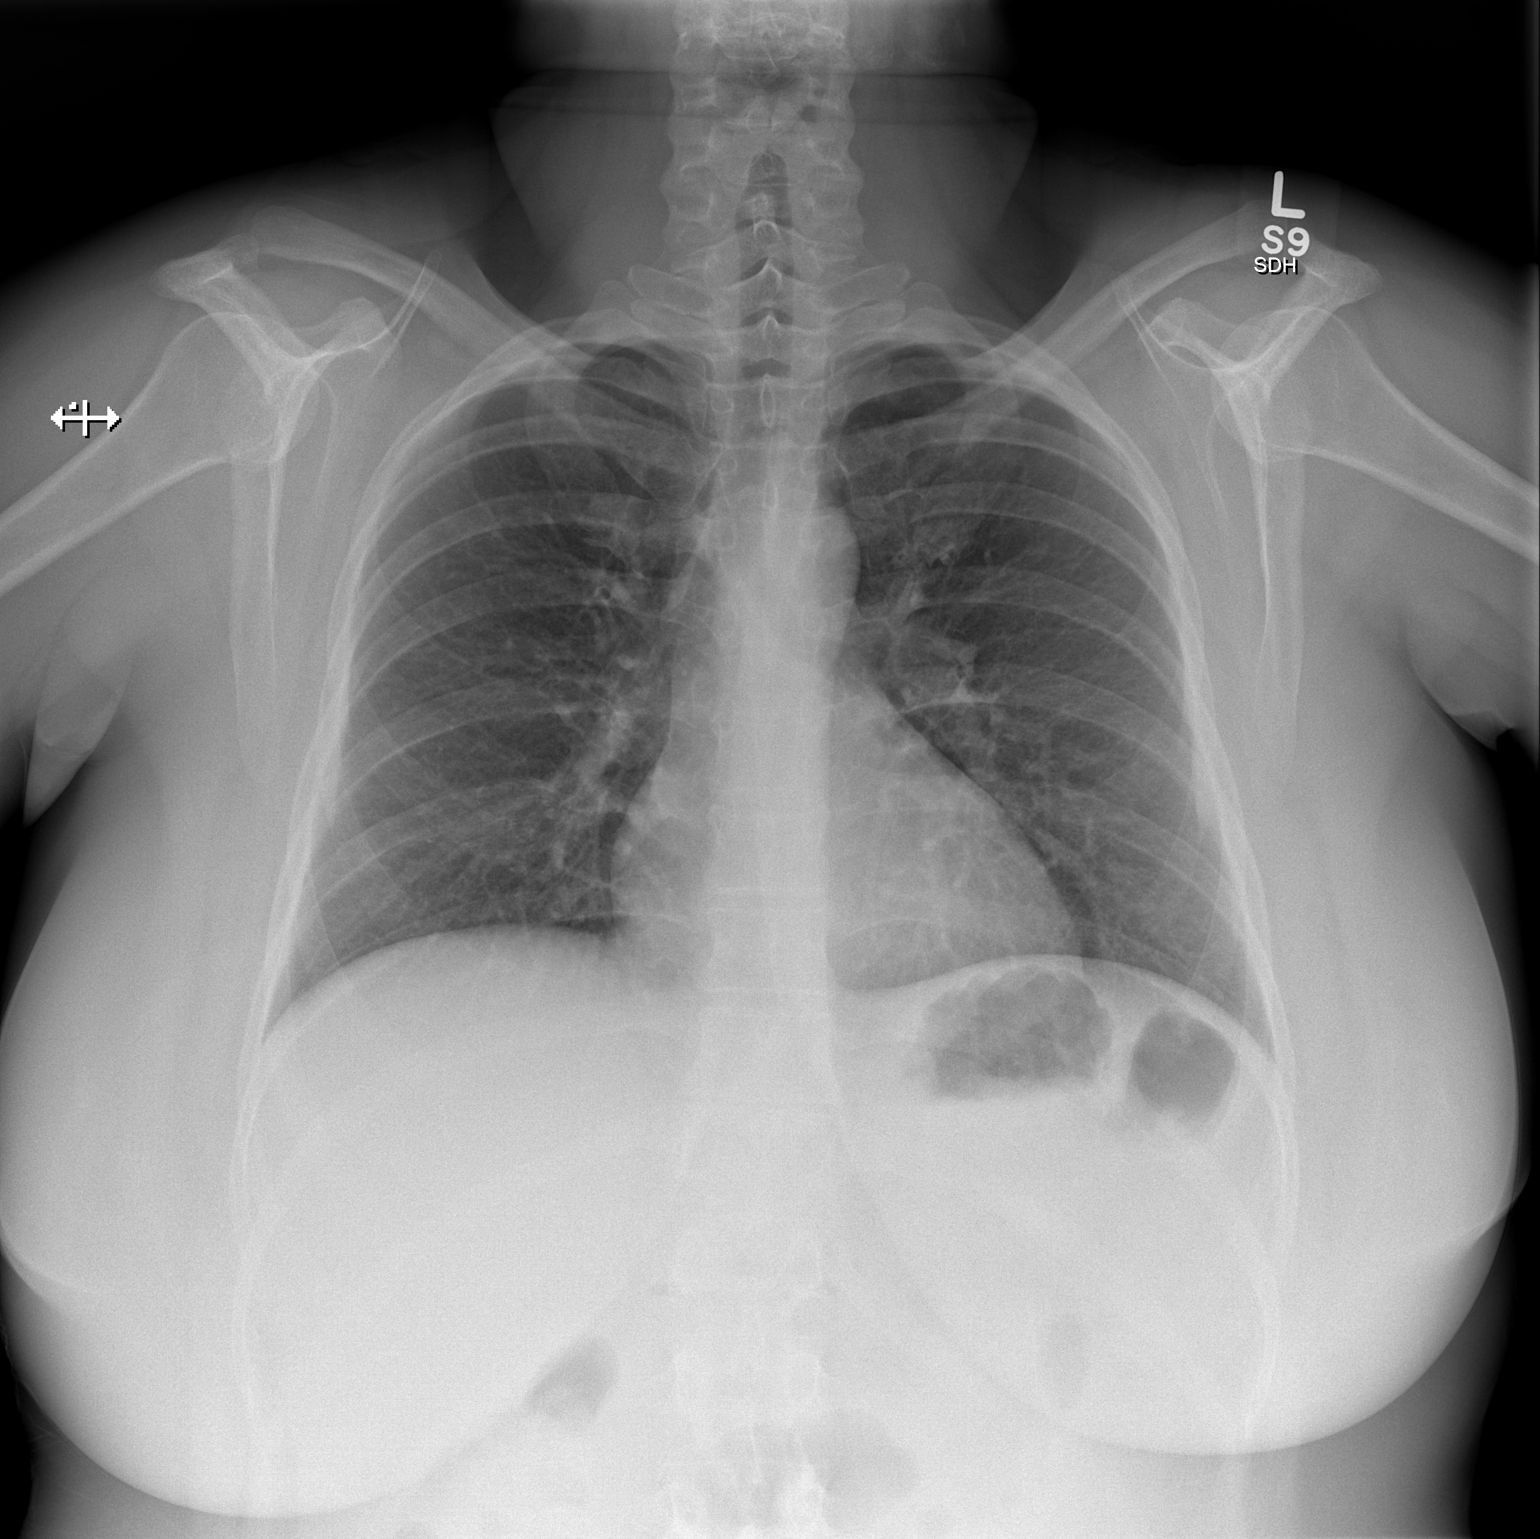
[im 2/2]
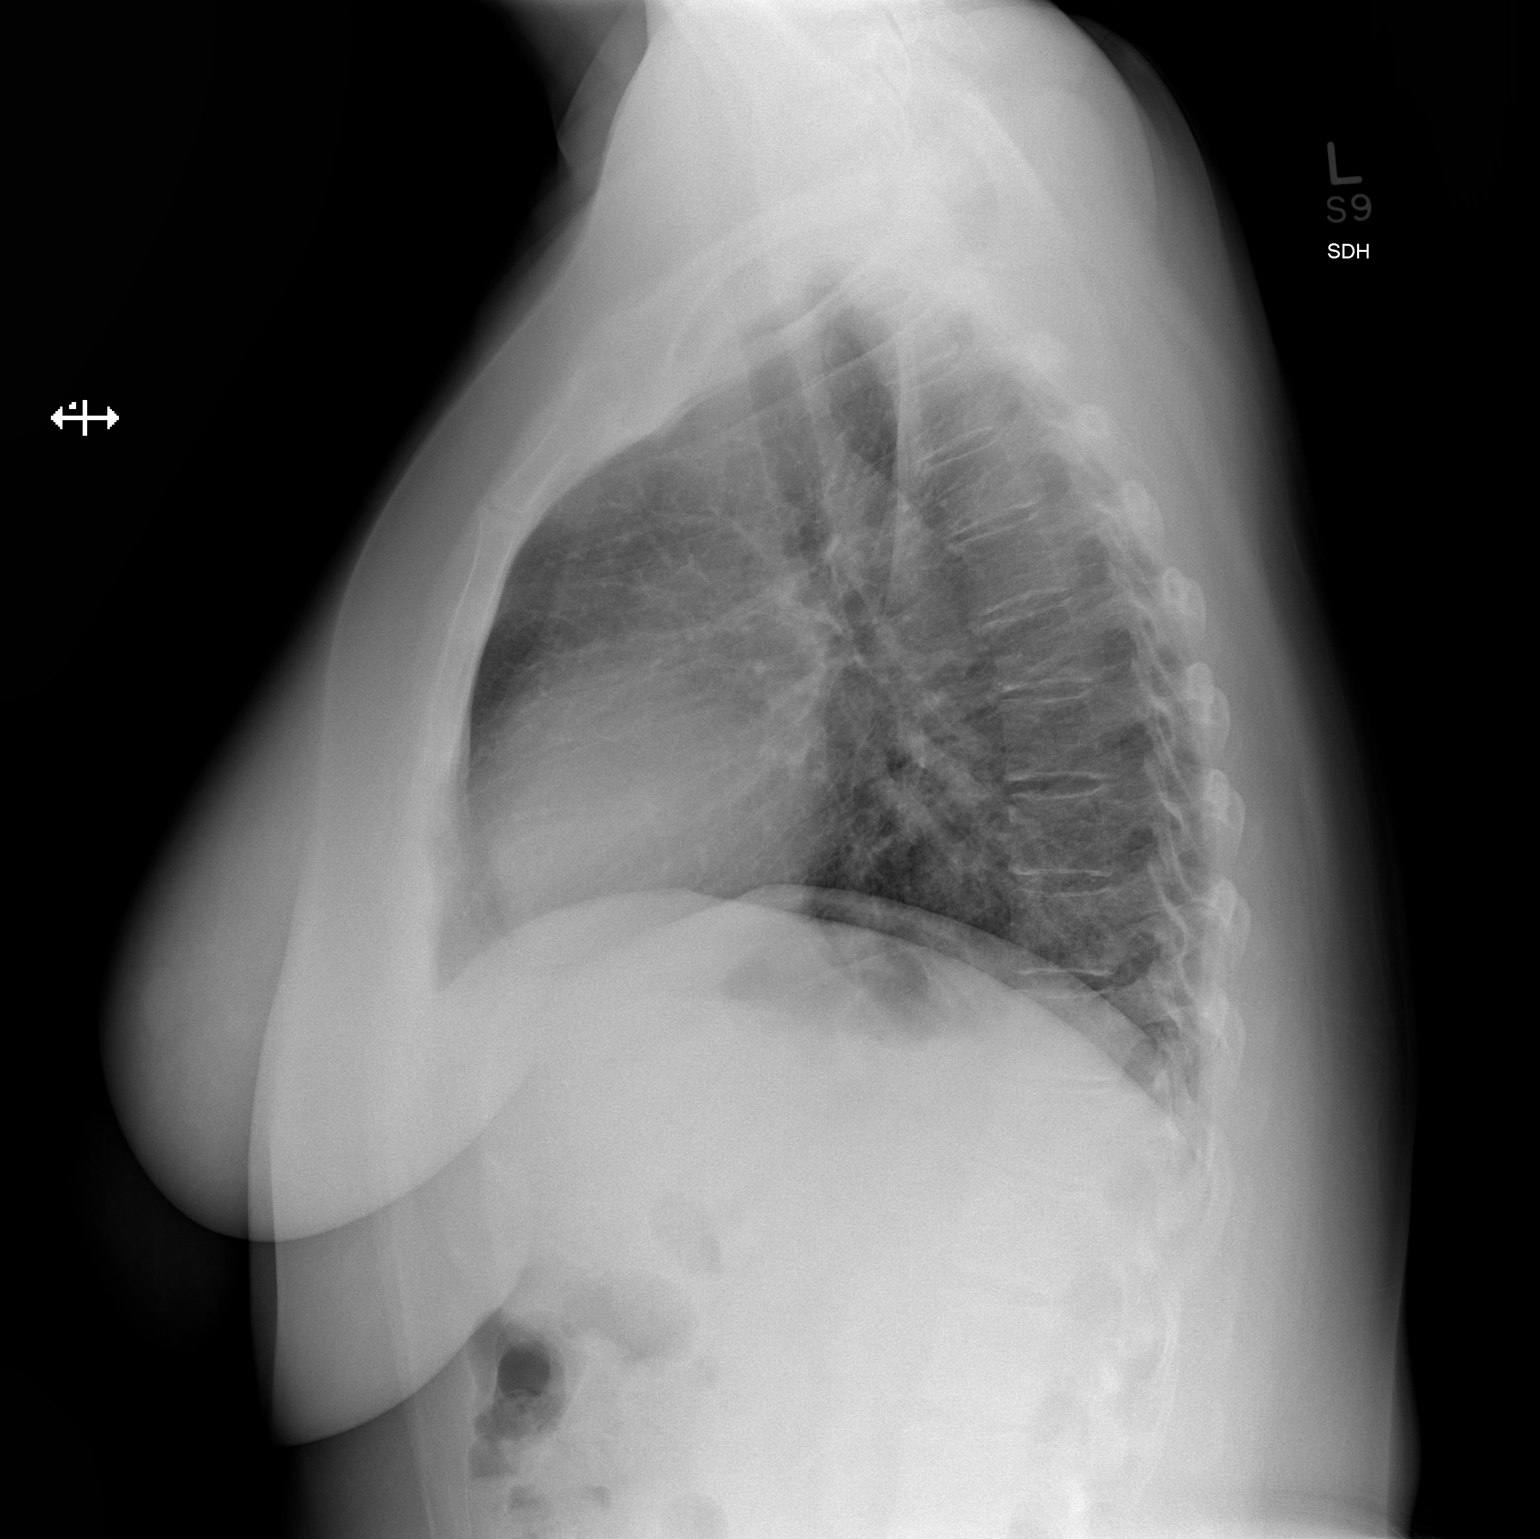

[2 of 2 positions shown; findings below may reference images not displayed]

FINDINGS: The lungs are adequately inflated and clear. The heart and pulmonary
vascularity are normal. The mediastinum is normal in width. There is
no pleural effusion. There is mild degenerative disc disease in the
lower thoracic spine.
IMPRESSION: There is no active cardiopulmonary disease.

## 2019-09-15 ENCOUNTER — Ambulatory Visit: Payer: BLUE CROSS/BLUE SHIELD | Attending: Internal Medicine

## 2019-09-15 DIAGNOSIS — Z20822 Contact with and (suspected) exposure to covid-19: Secondary | ICD-10-CM

## 2019-09-16 LAB — NOVEL CORONAVIRUS, NAA: SARS-CoV-2, NAA: NOT DETECTED

## 2022-05-14 ENCOUNTER — Other Ambulatory Visit: Payer: Self-pay

## 2022-05-14 ENCOUNTER — Emergency Department (HOSPITAL_COMMUNITY)
Admission: EM | Admit: 2022-05-14 | Discharge: 2022-05-14 | Disposition: A | Discharge status: Home / Self Care | Payer: BC Managed Care – PPO | Attending: Emergency Medicine | Admitting: Emergency Medicine

## 2022-05-14 ENCOUNTER — Encounter (HOSPITAL_COMMUNITY): Payer: Self-pay

## 2022-05-14 DIAGNOSIS — M25511 Pain in right shoulder: Secondary | ICD-10-CM | POA: Insufficient documentation

## 2022-05-14 DIAGNOSIS — Z5321 Procedure and treatment not carried out due to patient leaving prior to being seen by health care provider: Secondary | ICD-10-CM | POA: Insufficient documentation

## 2022-05-14 NOTE — ED Triage Notes (Signed)
Pt presents to ED after fall at work yesterday. Pt c/o pain in right shoulder down to elbow.

## 2022-05-14 NOTE — ED Notes (Signed)
Pt was instructed to go to a different facility by her employer for worker's comp.  Pt left with steady gait. EDP made aware of pt's leaving.

## 2022-05-14 NOTE — ED Provider Notes (Signed)
Per RN, patient eloped after speaking to her employer about needing to be seen in a specific place regarding Worker's Comp. claim.  Patient left before I could evaluate her.   Carlisle Cater, PA-C 05/14/22 1338    Audley Hose, MD 05/14/22 2102

## 2023-10-06 NOTE — Progress Notes (Unsigned)
 [name] [name], female    DOB: 02-Nov-1975    MRN: [id number]   Brief patient profile:  89  yowf   active smoker "asthma/bronchitis" better as teenager then again late 20s referred to pulmonary clinic in [place]  10/08/2023 by Annice Pih NP  with increased freq of "bronchitis" 3-4 x per year in between then bad again after xmas  with xray Aug 26 2023 ? Pna > all better p doxy/ prednisone       History of Present Illness  10/08/2023  Pulmonary/ 1st office eval/ [name] [name] / [place] [facility]  Chief Complaint  Patient presents with   Consult  Dyspnea:  sev hundred feet or steps but holding ground Cough: dry wakes up most nights and uses inhaler for cough/sob  Sleep: 30 degrees  SABA use: twice daily  02: none  LDSCT:none   No obvious day to day or daytime pattern/variability or assoc excess/ purulent sputum or mucus plugs or hemoptysis or cp or chest tightness, subjective wheeze or overt sinus or hb symptoms.    Also denies any obvious fluctuation of symptoms with weather or environmental changes or other aggravating or alleviating factors except as outlined above   No unusual exposure hx or h/o childhood pna/ asthma or knowledge of premature birth.  Current Allergies, Complete Past Medical History, Past Surgical History, Family History, and Social History were reviewed in [employer] record.  ROS  The following are not active complaints unless bolded Hoarseness, sore throat, dysphagia, dental problems, itching, sneezing,  nasal congestion or discharge of excess mucus or purulent secretions, ear ache,   fever, chills, sweats, unintended wt loss or wt gain, classically pleuritic or exertional cp,  orthopnea pnd or arm/hand swelling  or leg swelling, presyncope, palpitations, abdominal pain, anorexia, nausea, vomiting, diarrhea  or change in bowel habits or change in bladder habits, change in stools or change in urine, dysuria, hematuria,  rash, arthralgias, visual  complaints, headache, numbness, weakness or ataxia or problems with walking or coordination,  change in mood or  memory.            Outpatient Medications Prior to Visit  Medication Sig Dispense Refill   acetaminophen (TYLENOL) 500 MG tablet Take 500 mg by mouth every 6 (six) hours as needed for mild pain or headache.     albuterol (VENTOLIN HFA) 108 (90 BASE) MCG/ACT inhaler Inhale 2 puffs into the lungs every 4 (four) hours as needed. FOR SHORTNESS OF BREATH      divalproex (DEPAKOTE ER) 500 MG 24 hr tablet Take 1 tablet (500 mg total) by mouth daily. (Patient not taking: Reported on 10/08/2023) 30 tablet 3   No facility-administered medications prior to visit.    Past Medical History:  Diagnosis Date   Asthma    Headache(784.0)       Objective:     BP (!) 146/92   Pulse 78   Ht 5\' 4"  (1.626 m)   Wt 185 lb (83.9 kg)   SpO2 99% Comment: room air  BMI 31.76 kg/m   SpO2: 99 % (room air)  Pleasant ambulatory wf nad/ lungs are clear    HEENT : Oropharynx  clear      Nasal turbinates nl    NECK :  without  apparent JVD/ palpable Nodes/TM    LUNGS: no acc muscle use,  Nl contour chest which is clear to A and P bilaterally without cough on insp or exp maneuvers   CV:  RRR  no  s3 or murmur or increase in P2, and no edema   ABD:  mod obese soft and nontender   MS:  Gait nl   ext warm without deformities Or obvious joint restrictions  calf tenderness, cyanosis or clubbing    SKIN: warm and dry without lesions    NEURO:  alert, approp, nl sensorium with  no motor or cerebellar deficits apparent.    CXR PA and Lateral:   10/08/2023 :    I personally reviewed images and impression is as follows:     Pa and lateral 10/08/2023 wnl      Assessment   Asthmatic bronchitis , chronic (HCC) Active smoker - 10/08/2023  After extensive coaching inhaler device,  effectiveness =    80% rx symbicort 80 2bid and prn saba   Strongly doubt significant copd here as she quickly gets  better p each flare with prednisone so may be able to control this with symbicort 80 Take 2 puffs first thing in am and then another 2 puffs about 12 hours later and prn saba.  F/u with PFTs when availalbe         Each maintenance medication was reviewed in detail including emphasizing most importantly the difference between maintenance and prns and under what circumstances the prns are to be triggered using an action plan format where appropriate.  Total time for H and P, chart review, counseling, reviewing hfa device(s) and generating customized AVS unique to this office visit / same day charting = 30 min new pt eval        Cigarette smoker  Active smoker  4-5 min discussion re active cigarette smoking in addition to office E&M  Ask about tobacco use:   ongoing  Advise quitting   I took an extended  opportunity with this patient to outline the consequences of continued cigarette use  in airway disorders based on all the data we have from the multiple national lung health studies (perfomed over decades at millions of dollars in cost)  indicating that smoking cessation, not choice of inhalers or pulmonary physicians, is the most important aspect of her care.   Assess willingness:  Not committed at this point Assist in quit attempt:  Per PCP when ready Arrange follow up:   Follow up per Primary Care planned           Sandrea Hughs, MD 10/08/2023

## 2023-10-08 ENCOUNTER — Ambulatory Visit: Payer: Self-pay | Admitting: Internal Medicine

## 2023-10-08 ENCOUNTER — Encounter: Payer: Self-pay | Admitting: Internal Medicine

## 2023-10-08 ENCOUNTER — Ambulatory Visit (HOSPITAL_COMMUNITY)
Admission: RE | Admit: 2023-10-08 | Discharge: 2023-10-08 | Disposition: A | Discharge status: Home / Self Care | Source: Ambulatory Visit | Attending: Internal Medicine | Admitting: Internal Medicine

## 2023-10-08 VITALS — BP 146/92 | HR 78 | Ht 64.0 in | Wt 185.0 lb

## 2023-10-08 DIAGNOSIS — J4489 Other specified chronic obstructive pulmonary disease: Secondary | ICD-10-CM | POA: Diagnosis present

## 2023-10-08 DIAGNOSIS — F17210 Nicotine dependence, cigarettes, uncomplicated: Secondary | ICD-10-CM | POA: Diagnosis not present

## 2023-10-08 MED ORDER — BREZTRI AEROSPHERE 160-9-4.8 MCG/ACT IN AERO: 2.0000 | INHALATION_SPRAY | Freq: Two times a day (BID) | RESPIRATORY_TRACT | Status: DC

## 2023-10-08 MED ORDER — BUDESONIDE-FORMOTEROL FUMARATE 80-4.5 MCG/ACT IN AERO: INHALATION_SPRAY | RESPIRATORY_TRACT | 12 refills | Status: DC

## 2023-10-08 MED ORDER — BUDESONIDE-FORMOTEROL FUMARATE 80-4.5 MCG/ACT IN AERO: INHALATION_SPRAY | RESPIRATORY_TRACT | 12 refills | Status: AC

## 2023-10-08 NOTE — Assessment & Plan Note (Signed)
 Active smoker  4-5 min discussion re active cigarette smoking in addition to office E&M  Ask about tobacco use:   ongoing  Advise quitting   I took an extended  opportunity with this patient to outline the consequences of continued cigarette use  in airway disorders based on all the data we have from the multiple national lung health studies (perfomed over decades at millions of dollars in cost)  indicating that smoking cessation, not choice of inhalers or pulmonary physicians, is the most important aspect of her care.   Assess willingness:  Not committed at this point Assist in quit attempt:  Per PCP when ready Arrange follow up:   Follow up per Primary Care planned

## 2023-10-08 NOTE — Patient Instructions (Addendum)
 Plan A = Automatic = Always=    Breztri (symbiocort 80/dulera 100) ) Take 2 puffs first thing in am and then another 2 puffs about 12 hours later.    Work on inhaler technique:  relax and gently blow all the way out then take a nice smooth full deep breath back in, triggering the inhaler at same time you start breathing in.  Hold breath in for at least  5 seconds if you can. Blow out [employer])  thru nose. Rinse and gargle with water when done.  If mouth or throat bother you at all,  try brushing teeth/gums/tongue with arm and hammer toothpaste/ make a slurry and gargle and spit out.     Plan B = Backup (to supplement plan A, not to replace it) Only use your albuterol inhaler as a rescue medication to be used if you can't catch your breath by resting or doing a relaxed purse lip breathing pattern.  - The less you use it, the better it will work when you need it. - Ok to use the inhaler up to 2 puffs  every 4 hours if you must but call for appointment if use goes up over your usual need - Don't leave home without it !!  (think of it like the spare tire for your car)   Also  Ok to try albuterol 15 min before an activity (on alternating days)  that you know would usually make you short of breath and see if it makes any difference and if makes none then don't take albuterol after activity unless you can't catch your breath as this means it's the resting that helps, not the albuterol.   Please remember to go to the  x-ray department  @  [facility] for your tests - we will call you with the results when they are available     My office will be contacting you by phone for referral to PFT  - if you don't hear back from my office within one week please call us back or notify us thru MyChart and we'll address it right away.    Please schedule a follow up visit in 3 months but call sooner if needed  with all medications /inhalers/ solutions in hand so we can verify exactly what you are  taking. This includes all medications from all doctors and over the counters

## 2023-10-08 NOTE — Assessment & Plan Note (Addendum)
 Active smoker - 10/08/2023  After extensive coaching inhaler device,  effectiveness =    80% rx symbicort 80 2bid and prn saba   Strongly doubt significant copd here as she quickly gets better p each flare with prednisone so may be able to control this with symbicort 80 Take 2 puffs first thing in am and then another 2 puffs about 12 hours later and prn saba.  F/u with PFTs when availalbe         Each maintenance medication was reviewed in detail including emphasizing most importantly the difference between maintenance and prns and under what circumstances the prns are to be triggered using an action plan format where appropriate.  Total time for H and P, chart review, counseling, reviewing hfa device(s) and generating customized AVS unique to this office visit / same day charting = 30 min new pt eval

## 2024-01-04 NOTE — Progress Notes (Deleted)
 [name] [name], female    DOB: [birth year]    MRN: [id number]   Brief patient profile:  47  yowf   active smoker "asthma/bronchitis" better as teenager then again late 20s referred to pulmonary clinic in [place]  10/08/2023 by Oleh Berliner NP  with increased freq of "bronchitis" 3-4 x per year in between then bad again after xmas  with xray Aug 26 2023 ? Pna > all better p doxy/ prednisone        History of Present Illness  10/08/2023  Pulmonary/ 1st office eval/ [name] [name] / [place] [facility]  Chief Complaint  Patient presents with   Consult  Dyspnea:  sev hundred feet or steps but holding ground Cough: dry wakes up most nights and uses inhaler for cough/sob  Sleep: 30 degrees  SABA use: twice daily  02: none  LDSCT:none  Rec Plan A = Automatic = Always=    Breztri  (symbiocort 80/dulera 100) )   Work on inhaler technique:    Plan B = Backup (to supplement plan A, not to replace it) Only use your albuterol  inhaler as a rescue medication Also  Ok to try albuterol  15 min before an activity (on alternating days)  that you know would usually make you short of breath  My office will be contacting you by phone for referral to PFT > not done as of 01/08/2024  Cxr:No active cardiopulmonary disease.    Please schedule a follow up visit in 3 months but call sooner if needed  with all medications /inhalers/ solutions in hand    01/08/2024  f/u ov/[facility]/[address] re: *** maint on *** did *** bring meds  No chief complaint on file.   Dyspnea:  *** Cough: *** Sleeping: ***   resp cc  SABA use: *** 02: ***  Lung cancer screening: ***   No obvious day to day or daytime variability or assoc excess/ purulent sputum or mucus plugs or hemoptysis or cp or chest tightness, subjective wheeze or overt sinus or hb symptoms.    Also denies any obvious fluctuation of symptoms with weather or environmental changes or other aggravating or alleviating factors except as outlined above   No  unusual exposure hx or h/o childhood pna  or knowledge of premature birth.  Current Allergies, Complete Past Medical History, Past Surgical History, Family History, and Social History were reviewed in [employer] record.  ROS  The following are not active complaints unless bolded Hoarseness, sore throat, dysphagia, dental problems, itching, sneezing,  nasal congestion or discharge of excess mucus or purulent secretions, ear ache,   fever, chills, sweats, unintended wt loss or wt gain, classically pleuritic or exertional cp,  orthopnea pnd or arm/hand swelling  or leg swelling, presyncope, palpitations, abdominal pain, anorexia, nausea, vomiting, diarrhea  or change in bowel habits or change in bladder habits, change in stools or change in urine, dysuria, hematuria,  rash, arthralgias, visual complaints, headache, numbness, weakness or ataxia or problems with walking or coordination,  change in mood or  memory.        No outpatient medications have been marked as taking for the 01/08/24 encounter (Appointment) with Diamond Formica, MD.           Past Medical History:  Diagnosis Date   Asthma    Headache(784.0)       Objective:    Wts  01/08/2024           ***   10/08/23 185 lb (83.9 kg)  05/14/22 185 lb (83.9 kg)  02/13/17 180 lb (81.6 kg)      Vital signs reviewed  01/08/2024  - Note at rest 02 sats  ***% on ***   General appearance:    ***          Assessment

## 2024-01-08 ENCOUNTER — Ambulatory Visit: Admitting: Internal Medicine

## 2024-01-08 ENCOUNTER — Encounter: Payer: Self-pay | Admitting: Internal Medicine

## 2024-07-29 ENCOUNTER — Emergency Department (HOSPITAL_COMMUNITY)

## 2024-07-29 ENCOUNTER — Emergency Department (HOSPITAL_COMMUNITY)
Admission: EM | Admit: 2024-07-29 | Discharge: 2024-07-29 | Disposition: A | Discharge status: Home / Self Care | Attending: Emergency Medicine | Admitting: Emergency Medicine

## 2024-07-29 ENCOUNTER — Other Ambulatory Visit: Payer: Self-pay

## 2024-07-29 DIAGNOSIS — Y92410 Unspecified street and highway as the place of occurrence of the external cause: Secondary | ICD-10-CM | POA: Diagnosis not present

## 2024-07-29 DIAGNOSIS — S92324A Nondisplaced fracture of second metatarsal bone, right foot, initial encounter for closed fracture: Secondary | ICD-10-CM | POA: Insufficient documentation

## 2024-07-29 DIAGNOSIS — I10 Essential (primary) hypertension: Secondary | ICD-10-CM | POA: Insufficient documentation

## 2024-07-29 DIAGNOSIS — S52501A Unspecified fracture of the lower end of right radius, initial encounter for closed fracture: Secondary | ICD-10-CM | POA: Diagnosis not present

## 2024-07-29 DIAGNOSIS — S92344A Nondisplaced fracture of fourth metatarsal bone, right foot, initial encounter for closed fracture: Secondary | ICD-10-CM | POA: Diagnosis not present

## 2024-07-29 DIAGNOSIS — S92314A Nondisplaced fracture of first metatarsal bone, right foot, initial encounter for closed fracture: Secondary | ICD-10-CM | POA: Diagnosis not present

## 2024-07-29 DIAGNOSIS — S6991XA Unspecified injury of right wrist, hand and finger(s), initial encounter: Secondary | ICD-10-CM | POA: Diagnosis present

## 2024-07-29 DIAGNOSIS — S92301A Fracture of unspecified metatarsal bone(s), right foot, initial encounter for closed fracture: Secondary | ICD-10-CM

## 2024-07-29 DIAGNOSIS — S92334A Nondisplaced fracture of third metatarsal bone, right foot, initial encounter for closed fracture: Secondary | ICD-10-CM | POA: Diagnosis not present

## 2024-07-29 LAB — I-STAT CHEM 8, ED
BUN: 13 mg/dL (ref 6–20)
Calcium, Ion: 1.03 mmol/L — ABNORMAL LOW (ref 1.15–1.40)
Chloride: 106 mmol/L (ref 98–111)
Creatinine, Ser: 0.7 mg/dL (ref 0.44–1.00)
Glucose, Bld: 125 mg/dL — ABNORMAL HIGH (ref 70–99)
HCT: 38 % (ref 36.0–46.0)
Hemoglobin: 12.9 g/dL (ref 12.0–15.0)
Potassium: 3.6 mmol/L (ref 3.5–5.1)
Sodium: 138 mmol/L (ref 135–145)
TCO2: 21 mmol/L — ABNORMAL LOW (ref 22–32)

## 2024-07-29 LAB — CBC
HCT: 36.9 % (ref 36.0–46.0)
Hemoglobin: 12.2 g/dL (ref 12.0–15.0)
MCH: 29.3 pg (ref 26.0–34.0)
MCHC: 33.1 g/dL (ref 30.0–36.0)
MCV: 88.5 fL (ref 80.0–100.0)
Platelets: 334 K/uL (ref 150–400)
RBC: 4.17 MIL/uL (ref 3.87–5.11)
RDW: 12.4 % (ref 11.5–15.5)
WBC: 15.3 K/uL — ABNORMAL HIGH (ref 4.0–10.5)
nRBC: 0 % (ref 0.0–0.2)

## 2024-07-29 LAB — COMPREHENSIVE METABOLIC PANEL WITH GFR
ALT: 24 U/L (ref 0–44)
AST: 26 U/L (ref 15–41)
Albumin: 4.5 g/dL (ref 3.5–5.0)
Alkaline Phosphatase: 113 U/L (ref 38–126)
Anion gap: 13 (ref 5–15)
BUN: 12 mg/dL (ref 6–20)
CO2: 21 mmol/L — ABNORMAL LOW (ref 22–32)
Calcium: 9.4 mg/dL (ref 8.9–10.3)
Chloride: 104 mmol/L (ref 98–111)
Creatinine, Ser: 0.74 mg/dL (ref 0.44–1.00)
GFR, Estimated: >60 mL/min
Glucose, Bld: 126 mg/dL — ABNORMAL HIGH (ref 70–99)
Potassium: 3.7 mmol/L (ref 3.5–5.1)
Sodium: 138 mmol/L (ref 135–145)
Total Bilirubin: <0.2 mg/dL (ref 0.0–1.2)
Total Protein: 6.9 g/dL (ref 6.5–8.1)

## 2024-07-29 LAB — I-STAT CG4 LACTIC ACID, ED: Lactic Acid, Venous: 1.9 mmol/L (ref 0.5–1.9)

## 2024-07-29 LAB — PROTIME-INR
INR: 1 (ref 0.8–1.2)
Prothrombin Time: 13.3 s (ref 11.4–15.2)

## 2024-07-29 LAB — ETHANOL: Alcohol, Ethyl (B): <15 mg/dL

## 2024-07-29 LAB — SAMPLE TO BLOOD BANK

## 2024-07-29 LAB — TROPONIN T, HIGH SENSITIVITY
Troponin T High Sensitivity: <15 ng/L (ref 0–19)
Troponin T High Sensitivity: <15 ng/L (ref 0–19)

## 2024-07-29 MED ORDER — LIDOCAINE HCL (PF) 1 % IJ SOLN
30.0000 mL | Freq: Once | INTRAMUSCULAR | Status: AC
  Administered 2024-07-29: 30 mL
  Filled 2024-07-29: qty 30

## 2024-07-29 MED ORDER — MORPHINE SULFATE (PF) 4 MG/ML IV SOLN
4.0000 mg | Freq: Once | INTRAVENOUS | Status: AC
  Administered 2024-07-29: 4 mg via INTRAVENOUS
  Filled 2024-07-29: qty 1

## 2024-07-29 MED ORDER — IOHEXOL 350 MG/ML SOLN
75.0000 mL | Freq: Once | INTRAVENOUS | Status: AC | PRN
  Administered 2024-07-29: 75 mL via INTRAVENOUS

## 2024-07-29 MED ORDER — KETAMINE HCL 10 MG/ML IJ SOLN
INTRAMUSCULAR | Status: AC | PRN
  Administered 2024-07-29: 83.9 mg via INTRAVENOUS

## 2024-07-29 MED ORDER — KETAMINE HCL 50 MG/5ML IJ SOSY
0.5000 mg/kg | PREFILLED_SYRINGE | Freq: Once | INTRAMUSCULAR | Status: AC
  Administered 2024-07-29: 42 mg via INTRAVENOUS
  Filled 2024-07-29: qty 5

## 2024-07-29 MED ORDER — PROPOFOL 10 MG/ML IV BOLUS
INTRAVENOUS | Status: AC | PRN
  Administered 2024-07-29: 83.9 mg via INTRAVENOUS

## 2024-07-29 MED ORDER — OXYCODONE-ACETAMINOPHEN 5-325 MG PO TABS: 1.0000 | ORAL_TABLET | Freq: Three times a day (TID) | ORAL | 0 refills | Status: AC | PRN

## 2024-07-29 MED ORDER — PROPOFOL 10 MG/ML IV BOLUS
0.5000 mg/kg | Freq: Once | INTRAVENOUS | Status: AC
  Administered 2024-07-29: 42 mg via INTRAVENOUS
  Filled 2024-07-29: qty 20

## 2024-07-29 MED ORDER — FENTANYL CITRATE (PF) 50 MCG/ML IJ SOSY
50.0000 ug | PREFILLED_SYRINGE | Freq: Once | INTRAMUSCULAR | Status: AC
  Administered 2024-07-29: 50 ug via INTRAVENOUS
  Filled 2024-07-29: qty 1

## 2024-07-29 NOTE — ED Notes (Addendum)
 Pt asissted to urinate using bedpan

## 2024-07-29 NOTE — ED Notes (Signed)
 Ortho at [facility] applying finger trap
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
## 2024-07-29 NOTE — ED Notes (Signed)
 CCMD called; Pt. Is on the monitor

## 2024-07-29 NOTE — Progress Notes (Signed)
 Orthopedic Tech Progress Note Patient Details:  [name] [name] 1975-09-05 [id number]  Well-padded sugar tong splint and sling placed to the RUE following reduction/manipulation by Dr. Pamella. Motion and sensation of her fingers remain intact. Total time in finger traps was ~30 mins.  An ace wrap and CAM boot were also placed to the RLE. Ice and elevation was encouraged to help with pain/swelling.  Ortho Devices Type of Ortho Device: Finger trap, Arm sling, CAM walker, Sugartong splint Finger Trap Weight: 5 Ortho Device/Splint Location: RUE, boot-RLE Ortho Device/Splint Interventions: Ordered, Application, Adjustment   Post Interventions Patient Tolerated: Fair Instructions Provided: Care of device, Adjustment of device  [name] Ronal Brasil 07/29/2024, 7:27 PM

## 2024-07-29 NOTE — Progress Notes (Signed)
 Orthopedic Tech Progress Note Patient Details:  [name] [name] Apr 02, 1976 [id number]  Finger traps with 5lbs of traction placed at 1740 to the RUE, pt could not tolerate over 5lbs. All finger traps are secure at this time.   Patient ID: [name] [name] Marc, female   DOB: 29-Jun-1976, 48 y.o.   MRN: [id number]  [name] [name] 07/29/2024, 5:46 PM

## 2024-07-29 NOTE — ED Notes (Signed)
 XR at [facility].
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
## 2024-08-02 ENCOUNTER — Encounter (HOSPITAL_BASED_OUTPATIENT_CLINIC_OR_DEPARTMENT_OTHER): Payer: Self-pay | Admitting: Orthopedic Surgery

## 2024-08-02 ENCOUNTER — Other Ambulatory Visit: Payer: Self-pay

## 2024-08-02 ENCOUNTER — Ambulatory Visit: Payer: Self-pay | Admitting: Orthopedic Surgery

## 2024-08-02 DIAGNOSIS — S52571A Other intraarticular fracture of lower end of right radius, initial encounter for closed fracture: Secondary | ICD-10-CM

## 2024-08-03 ENCOUNTER — Encounter (HOSPITAL_BASED_OUTPATIENT_CLINIC_OR_DEPARTMENT_OTHER)
Admission: RE | Disposition: A | Discharge status: Home / Self Care | Payer: Self-pay | Source: Home / Self Care | Attending: Orthopedic Surgery

## 2024-08-03 ENCOUNTER — Ambulatory Visit (HOSPITAL_BASED_OUTPATIENT_CLINIC_OR_DEPARTMENT_OTHER)
Admission: RE | Admit: 2024-08-03 | Discharge: 2024-08-03 | Disposition: A | Payer: Self-pay | Source: Home / Self Care | Attending: Orthopedic Surgery | Admitting: Orthopedic Surgery

## 2024-08-03 ENCOUNTER — Ambulatory Visit (HOSPITAL_BASED_OUTPATIENT_CLINIC_OR_DEPARTMENT_OTHER): Payer: Self-pay

## 2024-08-03 ENCOUNTER — Other Ambulatory Visit: Payer: Self-pay

## 2024-08-03 ENCOUNTER — Encounter (HOSPITAL_BASED_OUTPATIENT_CLINIC_OR_DEPARTMENT_OTHER): Payer: Self-pay | Admitting: Orthopedic Surgery

## 2024-08-03 DIAGNOSIS — F17200 Nicotine dependence, unspecified, uncomplicated: Secondary | ICD-10-CM | POA: Insufficient documentation

## 2024-08-03 DIAGNOSIS — S52571A Other intraarticular fracture of lower end of right radius, initial encounter for closed fracture: Secondary | ICD-10-CM

## 2024-08-03 DIAGNOSIS — S52501A Unspecified fracture of the lower end of right radius, initial encounter for closed fracture: Secondary | ICD-10-CM

## 2024-08-03 DIAGNOSIS — J45909 Unspecified asthma, uncomplicated: Secondary | ICD-10-CM | POA: Insufficient documentation

## 2024-08-03 DIAGNOSIS — I10 Essential (primary) hypertension: Secondary | ICD-10-CM | POA: Insufficient documentation

## 2024-08-03 DIAGNOSIS — S52611K Displaced fracture of right ulna styloid process, subsequent encounter for closed fracture with nonunion: Secondary | ICD-10-CM | POA: Insufficient documentation

## 2024-08-03 DIAGNOSIS — S52571D Other intraarticular fracture of lower end of right radius, subsequent encounter for closed fracture with routine healing: Secondary | ICD-10-CM | POA: Insufficient documentation

## 2024-08-03 HISTORY — PX: OPEN REDUCTION INTERNAL FIXATION (ORIF) DISTAL RADIAL FRACTURE: SHX5989

## 2024-08-03 HISTORY — DX: Essential (primary) hypertension: I10

## 2024-08-03 SURGERY — OPEN REDUCTION INTERNAL FIXATION (ORIF) DISTAL RADIUS FRACTURE
Anesthesia: Monitor Anesthesia Care | Site: Wrist | Laterality: Right

## 2024-08-03 MED ORDER — LACTATED RINGERS IV SOLN: INTRAVENOUS | Status: DC

## 2024-08-03 MED ORDER — FENTANYL CITRATE (PF) 100 MCG/2ML IJ SOLN
INTRAMUSCULAR | Status: AC
  Filled 2024-08-03: qty 2

## 2024-08-03 MED ORDER — PROPOFOL 500 MG/50ML IV EMUL
INTRAVENOUS | Status: DC | PRN
  Administered 2024-08-03: 125 ug/kg/min via INTRAVENOUS

## 2024-08-03 MED ORDER — PHENYLEPHRINE HCL-NACL 20-0.9 MG/250ML-% IV SOLN
INTRAVENOUS | Status: DC | PRN
  Administered 2024-08-03: 40 ug/min via INTRAVENOUS

## 2024-08-03 MED ORDER — CEFAZOLIN SODIUM-DEXTROSE 2-4 GM/100ML-% IV SOLN
2.0000 g | INTRAVENOUS | Status: AC
  Administered 2024-08-03: 2 g via INTRAVENOUS

## 2024-08-03 MED ORDER — PHENYLEPHRINE HCL (PRESSORS) 10 MG/ML IV SOLN
INTRAVENOUS | Status: AC
  Filled 2024-08-03: qty 1

## 2024-08-03 MED ORDER — MIDAZOLAM HCL (PF) 2 MG/2ML IJ SOLN
2.0000 mg | Freq: Once | INTRAMUSCULAR | Status: AC
  Administered 2024-08-03: 2 mg via INTRAVENOUS

## 2024-08-03 MED ORDER — 0.9 % SODIUM CHLORIDE (POUR BTL) OPTIME
TOPICAL | Status: DC | PRN
  Administered 2024-08-03: 1000 mL

## 2024-08-03 MED ORDER — BUPIVACAINE-EPINEPHRINE (PF) 0.5% -1:200000 IJ SOLN
INTRAMUSCULAR | Status: DC | PRN
  Administered 2024-08-03: 30 mL via PERINEURAL

## 2024-08-03 MED ORDER — FENTANYL CITRATE (PF) 100 MCG/2ML IJ SOLN
100.0000 ug | Freq: Once | INTRAMUSCULAR | Status: AC
  Administered 2024-08-03: 100 ug via INTRAVENOUS

## 2024-08-03 MED ORDER — CEFAZOLIN SODIUM-DEXTROSE 2-4 GM/100ML-% IV SOLN
INTRAVENOUS | Status: AC
  Filled 2024-08-03: qty 100

## 2024-08-03 MED ORDER — MIDAZOLAM HCL 2 MG/2ML IJ SOLN
INTRAMUSCULAR | Status: AC
  Filled 2024-08-03: qty 2

## 2024-08-03 MED ORDER — PHENYLEPHRINE HCL (PRESSORS) 10 MG/ML IV SOLN
INTRAVENOUS | Status: DC | PRN
  Administered 2024-08-03 (×6): 80 ug via INTRAVENOUS

## 2024-08-03 MED ORDER — ONDANSETRON HCL 4 MG/2ML IJ SOLN
INTRAMUSCULAR | Status: DC | PRN
  Administered 2024-08-03: 4 mg via INTRAVENOUS

## 2024-08-03 MED ORDER — ACETAMINOPHEN 500 MG PO TABS
ORAL_TABLET | ORAL | Status: AC
  Filled 2024-08-03: qty 2

## 2024-08-03 MED ORDER — OXYCODONE HCL 5 MG PO TABS: 5.0000 mg | ORAL_TABLET | Freq: Three times a day (TID) | ORAL | 0 refills | Status: AC | PRN

## 2024-08-03 MED ORDER — DEXMEDETOMIDINE HCL IN NACL 80 MCG/20ML IV SOLN
INTRAVENOUS | Status: DC | PRN
  Administered 2024-08-03: 8 ug via INTRAVENOUS
  Administered 2024-08-03: 4 ug via INTRAVENOUS

## 2024-08-03 MED ORDER — DEXAMETHASONE SOD PHOSPHATE PF 10 MG/ML IJ SOLN
INTRAMUSCULAR | Status: DC | PRN
  Administered 2024-08-03: 5 mg via INTRAVENOUS

## 2024-08-03 SURGICAL SUPPLY — 57 items
BIT DRILL SOLID 2.0X40MM (BIT) IMPLANT
BIT DRILL SOLID 2.5X40MM (BIT) IMPLANT
BLADE SURG 15 STRL LF DISP TIS (BLADE) ×2 IMPLANT
BNDG COHESIVE 4X5 TAN STRL LF (GAUZE/BANDAGES/DRESSINGS) ×1 IMPLANT
BNDG COMPR ESMARK 4X3 LF (GAUZE/BANDAGES/DRESSINGS) ×1 IMPLANT
BNDG ELASTIC 4INX 5YD STR LF (GAUZE/BANDAGES/DRESSINGS) ×2 IMPLANT
BNDG GAUZE DERMACEA FLUFF 4 (GAUZE/BANDAGES/DRESSINGS) ×1 IMPLANT
CHLORAPREP W/TINT 26 (MISCELLANEOUS) ×1 IMPLANT
CORD BIPOLAR FORCEPS 12FT (ELECTRODE) ×1 IMPLANT
COVER BACK TABLE 60X90IN (DRAPES) ×1 IMPLANT
CUFF TOURN SGL QUICK 18X4 (TOURNIQUET CUFF) ×1 IMPLANT
CUFF TRNQT CYL 24X4X16.5-23 (TOURNIQUET CUFF) IMPLANT
DRAPE HAND 77X146 (DRAPES) ×1 IMPLANT
DRAPE OEC MINIVIEW 54X84 (DRAPES) ×1 IMPLANT
DRAPE SURG 17X23 STRL (DRAPES) ×1 IMPLANT
DRIVER QUICK CONNECT T10 (MISCELLANEOUS) IMPLANT
GAUZE PAD ABD 8X10 STRL (GAUZE/BANDAGES/DRESSINGS) ×1 IMPLANT
GAUZE SPONGE 4X4 12PLY STRL (GAUZE/BANDAGES/DRESSINGS) ×1 IMPLANT
GAUZE XEROFORM 1X8 LF (GAUZE/BANDAGES/DRESSINGS) ×1 IMPLANT
GLOVE BIO SURGEON STRL SZ7.5 (GLOVE) ×2 IMPLANT
GLOVE BIOGEL PI IND STRL 7.5 (GLOVE) ×2 IMPLANT
GOWN STRL REUS W/ TWL LRG LVL3 (GOWN DISPOSABLE) ×1 IMPLANT
GOWN STRL SURGICAL XL XLNG (GOWN DISPOSABLE) ×2 IMPLANT
GUIDE AIMING 1.5MM (WIRE) IMPLANT
MANIFOLD NEPTUNE II (INSTRUMENTS) ×1 IMPLANT
NEEDLE HYPO 25X1 1.5 SAFETY (NEEDLE) IMPLANT
NEEDLE HYPO 25X5/8 SAFETYGLIDE (NEEDLE) IMPLANT
PACK BASIN DAY SURGERY FS (CUSTOM PROCEDURE TRAY) ×1 IMPLANT
PAD CAST 4YDX4 CTTN HI CHSV (CAST SUPPLIES) ×2 IMPLANT
PEG GEMINUS THRD LOCK 2.3X17 (Peg) IMPLANT
PEG GEMINUS THRD TPNL 2.7X26 (Peg) IMPLANT
PEG THREADED LOCK 2.3X16 (Screw) IMPLANT
PEG THREADED LOCK 2.3X18 (Screw) IMPLANT
PLATE DISTAL RADIUS 4H (Plate) IMPLANT
SCREW CORT LOCK 3.5X10 TI (Screw) IMPLANT
SCREW CORT LOCK 3.5X12 TI (Screw) IMPLANT
SCREW NONLOCK POLYAXIAL 3.5X10 (Screw) IMPLANT
SCREWDRIVER SURG ST 2 (INSTRUMENTS) IMPLANT
SHEET MEDIUM DRAPE 40X70 STRL (DRAPES) ×1 IMPLANT
SLEEVE SCD COMPRESS KNEE MED (STOCKING) IMPLANT
SLING ARM FOAM STRAP LRG (SOFTGOODS) IMPLANT
SOLN 0.9% NACL POUR BTL 1000ML (IV SOLUTION) IMPLANT
SPIKE FLUID TRANSFER (MISCELLANEOUS) IMPLANT
SPLINT PLASTER CAST XFAST 4X15 (CAST SUPPLIES) ×10 IMPLANT
SPONGE T-LAP 4X18 ~~LOC~~+RFID (SPONGE) IMPLANT
STOCKINETTE IMPERVIOUS 9X36 MD (GAUZE/BANDAGES/DRESSINGS) ×1 IMPLANT
SUCTION TUBE FRAZIER 10FR DISP (SUCTIONS) ×1 IMPLANT
SUT ETHILON 4 0 PS 2 18 (SUTURE) ×1 IMPLANT
SUT MNCRL AB 3-0 PS2 18 (SUTURE) IMPLANT
SUT MNCRL AB 3-0 PS2 27 (SUTURE) ×1 IMPLANT
SUT VIC AB 4-0 PS2 18 (SUTURE) IMPLANT
SYR BULB EAR ULCER 3OZ GRN STR (SYRINGE) ×2 IMPLANT
SYR CONTROL 10ML LL (SYRINGE) IMPLANT
TOWEL GREEN STERILE FF (TOWEL DISPOSABLE) ×2 IMPLANT
TUBE CONNECTING 20X1/4 (TUBING) ×1 IMPLANT
UNDERPAD 30X36 HEAVY ABSORB (UNDERPADS AND DIAPERS) ×1 IMPLANT
WIRE FIX 1.5 STD TIP (WIRE) IMPLANT

## 2024-08-03 NOTE — Op Note (Signed)
 NAME: [name] [name] MEDICAL RECORD NO: [id number] DATE OF BIRTH: Jan 21, 1976 FACILITY: [facility] LOCATION: [facility] PHYSICIAN: GILDARDO ALDERTON, MD   OPERATIVE REPORT   DATE OF PROCEDURE: 08/03/2024    PREOPERATIVE DIAGNOSIS: Right wrist distal radius fracture, intra-articular with associated ulnar styloid fracture   POSTOPERATIVE DIAGNOSIS: Right wrist distal radius fracture, intra-articular with associated ulnar styloid fracture   PROCEDURE: Right wrist distal radius fracture open reduction internal fixation, intra-articular 3+ fragment   SURGEON:  Gildardo Alderton, M.D.   ASSISTANT: Joesph Dinsmore, OPA   ANESTHESIA:  Regional with sedation   INTRAVENOUS FLUIDS:  Per anesthesia flow sheet.   ESTIMATED BLOOD LOSS:  Minimal.   COMPLICATIONS:  None.   SPECIMENS:  none   TOURNIQUET TIME:    Total Tourniquet Time Documented: Upper Arm (Right) - 58 minutes Total: Upper Arm (Right) - 58 minutes    DISPOSITION:  Stable to PACU.   INDICATIONS: 48 year old female who sustained a right distal radius fracture with intra-articular extension and comminution, also found to have an associated ulnar styloid fracture, patient was indicated for right wrist open reduction internal fixation in order to restore anatomy, promote healing and early mobilization.  Risks and benefits of surgery were discussed including the risks of infection, bleeding, scarring, stiffness, nerve injury, vascular injury, tendon injury, need for subsequent operation, hardware failure, nonunion, malunion.  She voiced understanding of these risks and elected to proceed.  OPERATIVE COURSE: Patient was seen and identified in the preoperative area and marked appropriately.  Surgical consent had been signed. Preoperative IV antibiotic prophylaxis was given. She was transferred to the operating room and placed in supine position with the Right upper extremity on an arm board.  Sedation was induced by the  anesthesiologist. A regional block had been performed by anesthesia in preoperative holding.    Right upper extremity was prepped and draped in normal sterile orthopedic fashion.  A surgical pause was performed between the surgeons, anesthesia, and operating room staff and all were in agreement as to the patient, procedure, and site of procedure.  Tourniquet was placed and padded appropriately to the right upper arm.  The arm was exsanguinated the tourniquet was inflated to 250 mmHg.  Longitudinal incision was designed over the volar radial aspect of the wrist, stemming from just proximal to the wrist crease into the forearm.  15 blade was utilized for skin, blunt dissection was performed down, all neurovascular structures were protected in their entirety throughout.  Radial artery was identified and protected.  Standard FCR approach was used.  Fibers over the FCR tendon were sectioned longitudinally, tendon was retracted ulnarly and FCR sheath was incised.  Blunt dissection performed, FPL also retracted ulnarly, after which pronator quadratus was elevated sharply from distal radius at the distal and radial borders in order to expose the underlying fracture site.  There was noted to be significant comminution at the fracture site with multiple intra-articular fragments.  To aid with reduction, brachioradialis tenotomy was performed utilizing a 15 blade.  Gentle reduction maneuver was performed of the fragments under biplanar fluoroscopy.  Once we were satisfied with our reduction, narrow 4-hole [employer] plate of appropriate laterality was selected and temporarily affixed to bone utilizing a combination of clamp and K wires.  Biplanar fluoroscopy was once again utilized to confirm appropriate plate placement and maintenance of fracture reduction.  Shaft screw was then placed to secure the plate proximally.  Nonlocking screw was utilized distally initially to capture the dorsal comminution and  restore  volar tilt.  Volar tilt was noted to be restored appropriately, joint line was well reduced.  Remaining distal holes were filled utilizing locking screws taking care to ensure all screws remained extra-articular and beneath the joint line.  Nonlocking screw distally was then swapped for a locking screw of shorter variety to once again ensure screw remained well within bone.  Remaining shaft screws were all filled using combination of locking and nonlocking screws in bicortical fashion.  Distal radioulnar joint was then stressed and noted to be stable in all planes.  Final fluoroscopic views were taken utilizing biplanar fluoroscopy which confirmed appropriate plate placement, all fracture fragments had been secured appropriately and joint line had been restored as well.  Gentle flexion and extension of the wrist under fluoroscopy indicated stable appearance of the wrist with appropriately secured fracture fragments.  The tourniquet was deflated at 58.  Fingertips were pink with brisk capillary refill after deflation of tourniquet.  Copious irrigation was performed.  Layered closure was performed utilizing a combination of 3-0 Monocryl for subcutaneous layer and 4-0 Vicryl for the skin surface.  Sterile dressings were applied followed by application of a short arm volar and dorsal slab splint utilizing plaster.  The operative drapes were broken down.  Sling was applied. The patient was awoken from anesthesia safely and taken to PACU in stable condition.   Post-operative plan: The patient will recover in the post-anesthesia care unit and then be discharged home.  The patient will be non weight bearing on the right upper extremity in a short arm splint.   I will see the patient back in the office in 2 weeks for postoperative followup.    Napoleon Monacelli, MD Electronically signed, 08/03/2024

## 2024-08-03 NOTE — Anesthesia Postprocedure Evaluation (Signed)
"   Anesthesia Post Note  Patient: [name] [name]  Procedure(s) Performed: OPEN REDUCTION INTERNAL FIXATION (ORIF) DISTAL RADIUS FRACTURE (Right: Wrist)     Patient location during evaluation: PACU Anesthesia Type: Regional and MAC Level of consciousness: awake and alert Pain management: pain level controlled Vital Signs Assessment: post-procedure vital signs reviewed and stable Respiratory status: spontaneous breathing, nonlabored ventilation and respiratory function stable Cardiovascular status: stable and blood pressure returned to baseline Postop Assessment: no apparent nausea or vomiting Anesthetic complications: no   No notable events documented.  Last Vitals:  Vitals:   08/03/24 1645 08/03/24 1700  BP: (!) 145/70 (!) 141/80  Pulse: 81   Resp: 17 16  Temp:  (!) 36.3 C  SpO2: 91% 94%    Last Pain:  Vitals:   08/03/24 1700  TempSrc:   PainSc: 0-No pain                 Mack Thurmon,W. EDMOND      "

## 2024-08-03 NOTE — Interval H&P Note (Signed)
 History and Physical Interval Note:  08/03/2024 1:01 PM  [name] [name]  has presented today for surgery, with the diagnosis of right distal radius fracture.  The various methods of treatment have been discussed with the patient and family. After consideration of risks, benefits and other options for treatment, the patient has consented to  Procedures with comments: OPEN REDUCTION INTERNAL FIXATION (ORIF) DISTAL RADIUS FRACTURE (Right) - right open reduction internal fixation with possible ulnard styloid fixation as a surgical intervention.  The patient's history has been reviewed, patient examined, no change in status, stable for surgery.  I have reviewed the patient's chart and labs.  Questions were answered to the patient's satisfaction.     [name]

## 2024-08-03 NOTE — Anesthesia Procedure Notes (Signed)
 Anesthesia Regional Block: Supraclavicular block   Pre-Anesthetic Checklist: , timeout performed,  Correct Patient, Correct Site, Correct Laterality,  Correct Procedure, Correct Position, site marked,  Risks and benefits discussed,  Pre-op evaluation,  At surgeon's request and post-op pain management  Laterality: Right  Prep: Maximum Sterile Barrier Precautions used, chloraprep       Needles:  Injection technique: Single-shot  Needle Type: Echogenic Stimulator Needle     Needle Length: 5cm  Needle Gauge: 22     Additional Needles:   Procedures:,,,, ultrasound used (permanent image in chart),,    Narrative:  Start time: 08/03/2024 1:50 PM End time: 08/03/2024 2:00 PM Injection made incrementally with aspirations every 5 mL.  Performed by: Personally  Anesthesiologist: Epifanio Fallow, MD

## 2024-08-03 NOTE — Discharge Instructions (Addendum)
" °  Post Anesthesia Home Care Instructions  Activity: Get plenty of rest for the remainder of the day. A responsible individual must stay with you for 24 hours following the procedure.  For the next 24 hours, DO NOT: -Drive a car -Advertising copywriter -Drink alcoholic beverages -Take any medication unless instructed by your physician -Make any legal decisions or sign important papers.  Meals: Start with liquid foods such as gelatin or soup. Progress to regular foods as tolerated. Avoid greasy, spicy, heavy foods. If nausea and/or vomiting occur, drink only clear liquids until the nausea and/or vomiting subsides. Call your physician if vomiting continues.  Special Instructions/Symptoms: Your throat may feel dry or sore from the anesthesia or the breathing tube placed in your throat during surgery. If this causes discomfort, gargle with warm salt water. The discomfort should disappear within 24 hours.  If you had a scopolamine patch placed behind your ear for the management of post- operative nausea and/or vomiting:  1. The medication in the patch is effective for 72 hours, after which it should be removed.  Wrap patch in a tissue and discard in the trash. Wash hands thoroughly with soap and water. 2. You may remove the patch earlier than 72 hours if you experience unpleasant side effects which may include dry mouth, dizziness or visual disturbances. 3. Avoid touching the patch. Wash your hands with soap and water after contact with the patch.        Hand Surgery Postop Instructions   Dressings: Maintain postoperative dressing until orthopedic follow-up.  Keep operative site clean and dry until orthopedic follow-up.  Wound Care: Keep your hand elevated above the level of your heart.  Do not allow it to dangle by your side. Moving your fingers is advised to stimulate circulation but will depend on the site of your surgery.  If you have a splint applied, your doctor will advise you  regarding movement.  Activity: Do not drive or operate machinery until clearance given from physician. No heavy lifting with operative extremity.  Diet:  Drink liquids today or eat a light diet.  You may resume a regular diet tomorrow.    General expectations: Take prescribed medication if given, transition to over-the-counter medication as quickly as possible. Fingers may become slightly swollen.  Call your doctor if any of the following occur: Severe pain not relieved by pain medication. Elevated temperature. Dressing soaked with blood. Inability to move fingers. White or bluish color to fingers.   Per [facility] clinic policy, our goal is ensure optimal postoperative pain control with a multimodal pain management strategy. For all [facility] patients, our goal is to wean post-operative narcotic medications by 6 weeks post-operatively. If this is not possible due to utilization of pain medication prior to surgery, your [facility] doctor will support your acute post-operative pain control for the first 6 weeks postoperatively, with a plan to transition you back to your primary pain team following that. [name] [name] will work to ensure a therapist, occupational.  [name] [name], M.D. Hand Surgery [facility]  "

## 2024-08-03 NOTE — Anesthesia Preprocedure Evaluation (Addendum)
"                                    Anesthesia Evaluation  Patient identified by MRN, date of birth, ID band Patient awake    Reviewed: Allergy & Precautions, H&P , NPO status , Patient's Chart, lab work & pertinent test results  Airway Mallampati: II  TM Distance: >3 FB Neck ROM: Full    Dental no notable dental hx. (+) Teeth Intact, Dental Advisory Given   Pulmonary asthma , Current Smoker   Pulmonary exam normal breath sounds clear to auscultation       Cardiovascular hypertension, Pt. on medications  Rhythm:Regular Rate:Normal     Neuro/Psych  Headaches  negative psych ROS   GI/Hepatic negative GI ROS, Neg liver ROS,,,  Endo/Other  negative endocrine ROS    Renal/GU Renal diseasenegative Renal ROS  negative genitourinary   Musculoskeletal   Abdominal   Peds  Hematology negative hematology ROS (+)  Hgb 12.9, Plts 334K (07/29/24)    Anesthesia Other Findings   Reproductive/Obstetrics negative OB ROS                              Anesthesia Physical Anesthesia Plan  ASA: 2  Anesthesia Plan: MAC and Regional   Post-op Pain Management: Minimal or no pain anticipated   Induction: Intravenous  PONV Risk Score and Plan: 1 and Propofol  infusion, Midazolam  and Ondansetron   Airway Management Planned: Natural Airway and Simple Face Mask  Additional Equipment:   Intra-op Plan:   Post-operative Plan:   Informed Consent: I have reviewed the patients History and Physical, chart, labs and discussed the procedure including the risks, benefits and alternatives for the proposed anesthesia with the patient or authorized representative who has indicated his/her understanding and acceptance.     Dental advisory given  Plan Discussed with: CRNA  Anesthesia Plan Comments:         Anesthesia Quick Evaluation  "

## 2024-08-03 NOTE — Transfer of Care (Signed)
 Immediate Anesthesia Transfer of Care Note  Patient: [name] [name]  Procedure(s) Performed: OPEN REDUCTION INTERNAL FIXATION (ORIF) DISTAL RADIUS FRACTURE (Right: Wrist)  Patient Location: PACU  Anesthesia Type:MAC and Regional  Level of Consciousness: awake and patient cooperative  Airway & Oxygen Therapy: Patient Spontanous Breathing and Patient connected to face mask oxygen  Post-op Assessment: Report given to RN and Post -op Vital signs reviewed and stable  Post vital signs: Reviewed and stable  Last Vitals:  Vitals Value Taken Time  BP    Temp    Pulse 80 08/03/24 16:35  Resp 14 08/03/24 16:35  SpO2 96 % 08/03/24 16:35  Vitals shown include unfiled device data.  Last Pain:  Vitals:   08/03/24 1209  TempSrc: Temporal  PainSc: 6       Patients Stated Pain Goal: 4 (08/03/24 1209)  Complications: No notable events documented.

## 2024-08-03 NOTE — Progress Notes (Signed)
Assisted Dr. Edmond Fitzgerald with right, supraclavicular, ultrasound guided block. Side rails up, monitors on throughout procedure. See vital signs in flow sheet. Tolerated Procedure well. 

## 2024-08-04 ENCOUNTER — Encounter (HOSPITAL_BASED_OUTPATIENT_CLINIC_OR_DEPARTMENT_OTHER): Payer: Self-pay | Admitting: Orthopedic Surgery

## 2024-08-04 ENCOUNTER — Telehealth: Payer: Self-pay | Admitting: Orthopedic Surgery

## 2024-08-04 ENCOUNTER — Ambulatory Visit: Admitting: Physician Assistant

## 2024-08-04 DIAGNOSIS — S92331A Displaced fracture of third metatarsal bone, right foot, initial encounter for closed fracture: Secondary | ICD-10-CM

## 2024-08-04 DIAGNOSIS — S92321A Displaced fracture of second metatarsal bone, right foot, initial encounter for closed fracture: Secondary | ICD-10-CM

## 2024-08-04 DIAGNOSIS — S92311A Displaced fracture of first metatarsal bone, right foot, initial encounter for closed fracture: Secondary | ICD-10-CM

## 2024-08-04 DIAGNOSIS — S92511A Displaced fracture of proximal phalanx of right lesser toe(s), initial encounter for closed fracture: Secondary | ICD-10-CM

## 2024-08-04 DIAGNOSIS — S92341A Displaced fracture of fourth metatarsal bone, right foot, initial encounter for closed fracture: Secondary | ICD-10-CM
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
## 2024-08-04 NOTE — Telephone Encounter (Signed)
 Pt came into office asking for a letter for employer for estimated time out of work. Please send to pt mychart. Pt number is [phone].

## 2024-08-10 ENCOUNTER — Telehealth: Payer: Self-pay | Admitting: Orthopedic Surgery

## 2024-08-10 NOTE — Telephone Encounter (Signed)
 Pt called saying that they need a note that states when she can return back to work. She needs it faxed to [employer]. Fax number is [phone]. Claim number is [id number]. Call back number is [phone].

## 2024-08-11 NOTE — Telephone Encounter (Signed)
 08/04/24 work note faxed [phone] claim number [id number]- per patients request.

## 2024-08-17 ENCOUNTER — Encounter: Admitting: Orthopedic Surgery
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
## 2024-08-19 ENCOUNTER — Ambulatory Visit: Payer: Self-pay | Admitting: Rehabilitative and Restorative Service Providers"

## 2024-08-19 ENCOUNTER — Ambulatory Visit: Payer: Self-pay

## 2024-08-19 ENCOUNTER — Encounter: Payer: Self-pay | Admitting: Rehabilitative and Restorative Service Providers"

## 2024-08-19 ENCOUNTER — Telehealth: Payer: Self-pay | Admitting: Orthopedic Surgery

## 2024-08-19 ENCOUNTER — Ambulatory Visit: Payer: Self-pay | Admitting: Orthopedic Surgery

## 2024-08-19 DIAGNOSIS — S92311A Displaced fracture of first metatarsal bone, right foot, initial encounter for closed fracture: Secondary | ICD-10-CM

## 2024-08-19 DIAGNOSIS — Z8781 Personal history of (healed) traumatic fracture: Secondary | ICD-10-CM

## 2024-08-19 DIAGNOSIS — M25631 Stiffness of right wrist, not elsewhere classified: Secondary | ICD-10-CM

## 2024-08-19 DIAGNOSIS — M25531 Pain in right wrist: Secondary | ICD-10-CM

## 2024-08-19 DIAGNOSIS — R600 Localized edema: Secondary | ICD-10-CM

## 2024-08-19 DIAGNOSIS — R278 Other lack of coordination: Secondary | ICD-10-CM

## 2024-08-19 DIAGNOSIS — S52571A Other intraarticular fracture of lower end of right radius, initial encounter for closed fracture: Secondary | ICD-10-CM

## 2024-08-19 DIAGNOSIS — Z9889 Other specified postprocedural states: Secondary | ICD-10-CM

## 2024-08-19 DIAGNOSIS — M6281 Muscle weakness (generalized): Secondary | ICD-10-CM

## 2024-08-19 NOTE — Telephone Encounter (Signed)
 Pt had an appt today and need a 2 wk post op. Please call pt about this at [phone]

## 2024-08-19 NOTE — Progress Notes (Signed)
" ° °  [name] [name] - 49 y.o. female MRN [id number]  Date of birth: 1976-06-25  Office Visit Note: Visit Date: 08/19/2024 PCP: Patient, No Pcp Per Referred by: No ref. provider found  Subjective:  HPI: [name] [name] is a 49 y.o. female who presents today for follow up 2 weeks status post right wrist distal radius fracture open reduction internal fixation, intra-articular 3+ fragment.  Doing well overall, does have some ongoing soreness and pain at the incisional site.  Digital range of motion is improving.  Pertinent ROS were reviewed with the patient and found to be negative unless otherwise specified above in HPI.   Assessment & Plan: Visit Diagnoses:  1. Other closed intra-articular fracture of distal end of right radius, initial encounter   2. Closed fracture of first metatarsal bone of right foot, physeal involvement unspecified, initial encounter   3. S/P ORIF (open reduction internal fixation) fracture     Plan: She is doing well overall.  X-rays obtained today show stable appearance of the distal radius fracture open reduction internal fixation, hardware remains well fixated.  I did once again explained to her the significance of her injury with notable comminution and displacement which will require extensive period of time for healing.  She will be seen by occupational therapy today for fabrication of a removable orthosis and continue with digital range of motion protocol.  Follow-up myself in 2 weeks for repeat clinical and radiographic check  Follow-up: No follow-ups on file.   Meds & Orders: No orders of the defined types were placed in this encounter.   Orders Placed This Encounter  Procedures   XR Wrist Complete Right     Procedures: No procedures performed       Objective:   Vital Signs: There were no vitals taken for this visit.  Ortho Exam Right wrist well-healed incisional site, no erythema or drainage, digital range of motion is preserved,  AIN/PIN/interosseous intact, sensation intact median/radial/ulnar distributions, hand remains warm well-perfused  Imaging: XR Wrist Complete Right Result Date: 08/19/2024 X-ray of the right wrist demonstrate still appearance of distal radius fracture, hardware remains well fixated in all planes without evidence of failure or migration.  Radial height and volar tilt are well-maintained in both the AP and lateral planes.    [name] [name], M.D. [facility], Hand Surgery  "

## 2024-08-19 NOTE — Telephone Encounter (Signed)
Appointment made and given to patient.
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
## 2024-08-25 ENCOUNTER — Ambulatory Visit: Payer: Self-pay | Admitting: Rehabilitative and Restorative Service Providers"

## 2024-08-25 ENCOUNTER — Ambulatory Visit: Payer: Self-pay | Admitting: Physician Assistant

## 2024-08-25 ENCOUNTER — Ambulatory Visit: Payer: Self-pay

## 2024-08-25 ENCOUNTER — Encounter: Payer: Self-pay | Admitting: Rehabilitative and Restorative Service Providers"

## 2024-08-25 DIAGNOSIS — M6281 Muscle weakness (generalized): Secondary | ICD-10-CM

## 2024-08-25 DIAGNOSIS — S92341A Displaced fracture of fourth metatarsal bone, right foot, initial encounter for closed fracture: Secondary | ICD-10-CM

## 2024-08-25 DIAGNOSIS — S92511A Displaced fracture of proximal phalanx of right lesser toe(s), initial encounter for closed fracture: Secondary | ICD-10-CM

## 2024-08-25 DIAGNOSIS — S92311A Displaced fracture of first metatarsal bone, right foot, initial encounter for closed fracture: Secondary | ICD-10-CM

## 2024-08-25 DIAGNOSIS — R278 Other lack of coordination: Secondary | ICD-10-CM

## 2024-08-25 DIAGNOSIS — M25631 Stiffness of right wrist, not elsewhere classified: Secondary | ICD-10-CM

## 2024-08-25 DIAGNOSIS — S92331A Displaced fracture of third metatarsal bone, right foot, initial encounter for closed fracture: Secondary | ICD-10-CM

## 2024-08-25 DIAGNOSIS — R600 Localized edema: Secondary | ICD-10-CM

## 2024-08-25 DIAGNOSIS — S92321A Displaced fracture of second metatarsal bone, right foot, initial encounter for closed fracture: Secondary | ICD-10-CM

## 2024-08-25 DIAGNOSIS — M25531 Pain in right wrist: Secondary | ICD-10-CM

## 2024-08-25 NOTE — Progress Notes (Signed)
 "  Office Visit Note   Patient: [name] [name]           Date of Birth: 04-Apr-1976           MRN: [id number] Visit Date: 08/25/2024              Requested by: No referring provider defined for this encounter. PCP: Patient, No Pcp Per   Assessment & Plan: Visit Diagnoses:  1. Closed fracture of first metatarsal bone of right foot, physeal involvement unspecified, initial encounter   2. Closed fracture of second metatarsal bone of right foot, physeal involvement unspecified, initial encounter   3. Closed fracture of third metatarsal bone of right foot, physeal involvement unspecified, initial encounter   4. Closed fracture of fourth metatarsal bone of right foot, physeal involvement unspecified, initial encounter   5. Closed fracture of proximal phalanx of lesser toe of right foot, physeal involvement unspecified, initial encounter     Plan: Impression is right foot 1st through 4th distal metatarsal fractures and proximal phalanx fracture of the right small toe.  Patient is radiographically improving.  I would like to keep her nonweightbearing in her cam boot for another 2 weeks.  She may then transition to weightbearing as tolerated in the cam boot.  Ice and elevate for pain and swelling.  Follow-up in 4 weeks for repeat evaluation and three-view x-rays of the right foot.  Call with concerns or questions.  Follow-Up Instructions: Return in about 4 weeks (around 09/22/2024).   Orders:  Orders Placed This Encounter  Procedures   XR Foot Complete Right   No orders of the defined types were placed in this encounter.     Procedures: No procedures performed   Clinical Data: No additional findings.   Subjective: Chief Complaint  Patient presents with   Right Foot - Pain    HPI patient is a pleasant 49 year old female who comes in today approximately 4 weeks status post right foot 1st through 4th metatarsal fractures as well as a fracture to the proximal phalanx of the small  toe.  She has been compliant nonweightbearing in a cam boot.  She has been taking over-the-counter medication for pain.  Review of Systems as detailed in HPI.  All others reviewed and are negative.   Objective: Vital Signs: There were no vitals taken for this visit.  Physical Exam well-developed well-nourished female no acute distress.  Alert and oriented x 3.  Ortho Exam right foot exam: Tenderness throughout the forefoot.  She does have decree sensation to the bottom of the big toe and is unable to dorsiflex the big toe.  She is however able to dorsiflex the ankle.  Specialty Comments:  No specialty comments available.  Imaging: XR Foot Complete Right Result Date: 08/25/2024 X-rays demonstrate consolidation to the 1st through 4th distal metatarsal fractures in addition to the proximal phalanx of the small toe.    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   Past Medical History:  Diagnosis Date   Asthma    Headache(784.0)    Hypertension     Family History  Problem Relation Age of Onset   Thyroid disease Mother    Cancer Maternal Grandfather  Thyroid disease Sister    Diabetes Brother     Past Surgical History:  Procedure Laterality Date   ABDOMINAL HYSTERECTOMY     APPENDECTOMY     CESAREAN SECTION     ESOPHAGOGASTRODUODENOSCOPY ENDOSCOPY     OPEN REDUCTION INTERNAL FIXATION (ORIF) DISTAL RADIAL FRACTURE Right 08/03/2024   Procedure: OPEN REDUCTION INTERNAL FIXATION (ORIF) DISTAL RADIUS FRACTURE;  Surgeon: Arlinda Buster, MD;  Location: [facility];  Service: Orthopedics;  Laterality: Right;  right open reduction internal fixation with  possible ulnard styloid fixation   OVARY SURGERY     right ovary removed   TONSILLECTOMY     TUBAL LIGATION     Social History   Occupational History   Occupation: employed  Tobacco Use   Smoking status: Every Day    Current packs/day: 0.50    Types: Cigarettes   Smokeless tobacco: Never  Vaping Use   Vaping status: Never Used  Substance and Sexual Activity   Alcohol use: Yes   Drug use: No   Sexual activity: Yes        "

## 2024-08-31 NOTE — Progress Notes (Signed)
" ° °  [name] [name] - 49 y.o. female MRN [id number]  Date of birth: 27-Apr-1976  Office Visit Note: Visit Date: 09/01/2024 PCP: Patient, No Pcp Per Referred by: No ref. provider found  Subjective:  HPI: [name] [name] is a 49 y.o. female who presents today for follow up 4 weeks status post right wrist distal radius fracture open reduction internal fixation, intra-articular 3+ fragment.  Doing well overall, does have some ongoing soreness and pain at the incisional site.  Digital range of motion is improving.  Pertinent ROS were reviewed with the patient and found to be negative unless otherwise specified above in HPI.   Assessment & Plan: Visit Diagnoses:  1. Closed fracture of first metatarsal bone of right foot, physeal involvement unspecified, initial encounter     Plan: She is doing well overall.  X-rays obtained today show stable appearance of the distal radius fracture open reduction internal fixation, hardware remains well fixated.  I did once again explained to her the significance of her injury with notable comminution and displacement which will require extensive period of time for healing.  Continue with range of motion over the upcoming month, progress to strengthening per protocol.  Follow-up with myself in 4 weeks.  Follow-up: No follow-ups on file.   Meds & Orders: No orders of the defined types were placed in this encounter.   Orders Placed This Encounter  Procedures   XR Wrist Complete Right     Procedures: No procedures performed       Objective:   Vital Signs: There were no vitals taken for this visit.  Ortho Exam Right wrist well-healed incisional site, no erythema or drainage, digital range of motion is preserved, AIN/PIN/interosseous intact, sensation intact median/radial/ulnar distributions, hand remains warm well-perfused  Imaging: No results found. X-rays of the right wrist were obtained today   Margie Brink Estela) Arlinda, M.D. [facility], [facility] Surgery  "

## 2024-09-01 ENCOUNTER — Other Ambulatory Visit: Payer: Self-pay

## 2024-09-01 ENCOUNTER — Ambulatory Visit: Payer: Self-pay | Admitting: Orthopedic Surgery

## 2024-09-01 DIAGNOSIS — S92311A Displaced fracture of first metatarsal bone, right foot, initial encounter for closed fracture: Secondary | ICD-10-CM | POA: Diagnosis not present
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
## 2024-09-02 ENCOUNTER — Ambulatory Visit: Payer: Self-pay | Admitting: Rehabilitative and Restorative Service Providers"

## 2024-09-02 ENCOUNTER — Encounter: Payer: Self-pay | Admitting: Rehabilitative and Restorative Service Providers"

## 2024-09-02 DIAGNOSIS — R278 Other lack of coordination: Secondary | ICD-10-CM

## 2024-09-02 DIAGNOSIS — M25631 Stiffness of right wrist, not elsewhere classified: Secondary | ICD-10-CM

## 2024-09-02 DIAGNOSIS — M25531 Pain in right wrist: Secondary | ICD-10-CM

## 2024-09-02 DIAGNOSIS — M6281 Muscle weakness (generalized): Secondary | ICD-10-CM | POA: Diagnosis not present

## 2024-09-02 DIAGNOSIS — R600 Localized edema: Secondary | ICD-10-CM

## 2024-09-07 ENCOUNTER — Encounter: Payer: Self-pay | Admitting: Rehabilitative and Restorative Service Providers"

## 2024-09-14 ENCOUNTER — Encounter: Payer: Self-pay | Admitting: Rehabilitative and Restorative Service Providers"

## 2024-09-21 ENCOUNTER — Ambulatory Visit: Admitting: Physician Assistant

## 2024-09-21 ENCOUNTER — Encounter: Payer: Self-pay | Admitting: Rehabilitative and Restorative Service Providers"

## 2024-09-28 ENCOUNTER — Encounter: Payer: Self-pay | Admitting: Rehabilitative and Restorative Service Providers"

## 2024-09-29 ENCOUNTER — Encounter: Payer: Self-pay | Admitting: Orthopedic Surgery

## 2024-10-05 ENCOUNTER — Encounter: Payer: Self-pay | Admitting: Rehabilitative and Restorative Service Providers"
